# Patient Record
Sex: Female | Born: 1975 | Marital: Married | Smoking: Never smoker
Health system: Northeastern US, Community
[De-identification: ages and names within clinical notes are randomized; demographics above are authoritative.]

## PROBLEM LIST (undated history)

## (undated) DIAGNOSIS — K219 Gastro-esophageal reflux disease without esophagitis: Secondary | ICD-10-CM

## (undated) DIAGNOSIS — O039 Complete or unspecified spontaneous abortion without complication: Secondary | ICD-10-CM

## (undated) DIAGNOSIS — D649 Anemia, unspecified: Secondary | ICD-10-CM

## (undated) HISTORY — PX: OB ANTEPARTUM CARE CESAREAN DLVR & POSTPARTUM: REP299

## (undated) HISTORY — DX: Gastro-esophageal reflux disease without esophagitis: K21.9

## (undated) HISTORY — DX: Anemia, unspecified: D64.9

## (undated) HISTORY — DX: Complete or unspecified spontaneous abortion without complication: O03.9

## (undated) HISTORY — PX: PR CESAREAN BIRTH CLASS: S9438

## (undated) HISTORY — PX: NO SIGNIFICANT SURGICAL HISTORY: 1000005

---

## 2006-11-07 ENCOUNTER — Encounter (HOSPITAL_BASED_OUTPATIENT_CLINIC_OR_DEPARTMENT_OTHER): Payer: Self-pay | Admitting: Allergy

## 2007-02-11 ENCOUNTER — Ambulatory Visit (HOSPITAL_BASED_OUTPATIENT_CLINIC_OR_DEPARTMENT_OTHER): Payer: Medicaid Other | Admitting: Internal Medicine

## 2007-04-28 DIAGNOSIS — O039 Complete or unspecified spontaneous abortion without complication: Secondary | ICD-10-CM

## 2007-04-28 HISTORY — DX: Complete or unspecified spontaneous abortion without complication: O03.9

## 2007-06-03 ENCOUNTER — Ambulatory Visit (HOSPITAL_BASED_OUTPATIENT_CLINIC_OR_DEPARTMENT_OTHER): Payer: Medicaid Other | Admitting: Internal Medicine

## 2007-06-03 VITALS — BP 110/70 | HR 88 | Temp 98.2°F | Resp 18 | Ht 62.0 in | Wt 159.0 lb

## 2007-06-03 DIAGNOSIS — M549 Dorsalgia, unspecified: Principal | ICD-10-CM

## 2007-06-03 LAB — URINE DIP (POINT OF CARE)
BILIRUBIN, URINE: NEGATIVE (ref 0–0)
GLUCOSE, URINE: NEGATIVE mg/dl (ref 0–0)
KETONE, URINE: NEGATIVE mg/dl (ref 0–0)
NITRITE, URINE: NEGATIVE
PH URINE: 7 (ref 5.0–8.0)
PROTEIN, URINE: NEGATIVE mg/dl (ref 0–15)
SPECIFIC GRAVITY URINE: 1.015 (ref 1.003–1.030)
UROBILINOGEN URINE: 0.2 mg/dl (ref 0.2–1.0)

## 2007-06-03 LAB — CHG LIPOPROTEIN DIRECT MEASUREMENT LDL CHOLESTEROL: LOW DENSITY LIPOPROTEIN DIRECT: 110 mg/dl — ABNORMAL HIGH (ref 0–100)

## 2007-06-03 LAB — GLUCOSE RANDOM: Glucose Random: 99 mg/dl (ref 74–160)

## 2007-06-03 LAB — CHG LIPOPROTEIN DIR MEAS HIGH DENSITY CHOLESTEROL: HIGH DENSITY LIPOPROTEIN: 51 mg/dl (ref 35–85)

## 2007-06-03 LAB — CHOLESTEROL SERUM/WHOLE BLOOD TOTAL: Cholesterol: 180 mg/dl (ref 0–200)

## 2007-06-03 MED ORDER — IBUPROFEN 600 MG PO TABS
ORAL_TABLET | ORAL | Status: AC
Start: 2007-06-03 — End: 2007-07-04

## 2007-06-03 NOTE — Progress Notes (Signed)
Claudia Lawrence is a 32 year old female who presents as new patient for acute visit. Multiple complaints. Sussen interpreting.    Back pain: left side. Intermittent pain. 5/10. No fall. No injury thart she recalls. 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 No radiation, no numbness tingling or weakness in legs. No bowel/bladder incontinence. Has been taking tylenol. Not used anything else recently.    Concerned about uti, recently diagnosed with one while at hospital. Treated with abx, symptoms improved. No dysuria, no hesitancy, no frequency. No foul odor. Nosymptoms currently.    Miscarraige: Occured 4 weeks ago, evaluated & treated at [facility]. No abd pain. No cramping. No bleeding. Feeling ok about it, did not know she was pregnant. (GA ~6 weeks, per patient)    PMH:  Denies    PSH:  Denies    Meds:  Tylenol    All:  NKDA      BP 110/70   Pulse 88   Temp (Src) 98.2 F (36.8 C) (Temporal)   Resp 18   Ht 5\' 2"  (1.575 m)   Wt 159 lb (72.122 kg)   SpO2 100%   LMP 04/01/2007    Gen well appearing female  Heent: mm pink and moist  Msk: some tenderness left paravertebral muscles, lumbar area, no vertebral tenderness, slr negative, strength and sensation nml  Gait: nml  Psych: pleasant alert and oriented    724.5E Back Pain  (primary encounter diagnosis)  Comment: Musculoligamentous  Plan: IBUPROFEN 600 MG OR TABS TID x 2 weeks  ------- heat or ice, rest (not bedrest)  ------- gentle stretching, offered work note    599.0W UTI  Comment: recent diagnosis and treatment  Plan: no symptoms now, ua done by staff, leuk 1 +, nitrates neg  ------ will hold on treatment, send for urine culture   ------ rtc if develops symptoms, fever or not feeling well  ------ records from [facility] (requested)       Would like to establish primary care w/ female provider

## 2007-06-04 DIAGNOSIS — M549 Dorsalgia, unspecified: Principal | ICD-10-CM | POA: Insufficient documentation

## 2007-06-04 LAB — HCG QUALITATIVE URINE: HCG QUALITATIVE URINE: NEGATIVE

## 2007-06-04 MED ORDER — CIPROFLOXACIN HCL 500 MG PO TABS
ORAL_TABLET | ORAL | Status: AC
Start: 2007-06-04 — End: 2007-06-07

## 2007-06-04 NOTE — Progress Notes (Addendum)
Addended by: Randell Patient on: 06/04/2007 1:12:43 PM     Modules accepted: Orders

## 2007-06-04 NOTE — Progress Notes (Addendum)
Urine culture came back positive form gram negatives. Placed call to patient. Left message on vm to call clinic Will treat as had recent uti and concerned about it. Also some flank pain--no fever or other symptoms ? Pyelonephritis. Awaiting records from recent hospitalization at [facility]. Will rx for cipro to pts pharmacy

## 2007-06-04 NOTE — Progress Notes (Addendum)
Addended by: Randell Patient on: 06/04/2007 5:38:07 PM     Modules accepted: Orders

## 2007-06-05 LAB — URINE CULTURE/COLONY COUNT

## 2007-06-05 NOTE — Progress Notes (Addendum)
Pt cale back.  Reviewed UTI and need for tx't.  Called script into her preferred pharm (Lm on MD VM).  Has f/u appt 06/24/07 w/PCP, reminded to do TOC urine at that visit.  Pt verbalized understanding.

## 2007-06-24 ENCOUNTER — Ambulatory Visit (HOSPITAL_BASED_OUTPATIENT_CLINIC_OR_DEPARTMENT_OTHER): Payer: Medicaid Other | Admitting: Family Medicine

## 2007-07-02 ENCOUNTER — Encounter (HOSPITAL_BASED_OUTPATIENT_CLINIC_OR_DEPARTMENT_OTHER): Payer: Self-pay

## 2007-07-02 ENCOUNTER — Ambulatory Visit (HOSPITAL_BASED_OUTPATIENT_CLINIC_OR_DEPARTMENT_OTHER): Payer: Medicaid Other

## 2007-07-02 VITALS — BP 92/50 | HR 88 | Temp 98.8°F | Ht 61.0 in | Wt 156.0 lb

## 2007-07-02 DIAGNOSIS — Z23 Encounter for immunization: Secondary | ICD-10-CM

## 2007-07-02 DIAGNOSIS — Z01419 Encounter for gynecological examination (general) (routine) without abnormal findings: Principal | ICD-10-CM

## 2007-07-02 DIAGNOSIS — K4190 Unilateral femoral hernia, without obstruction or gangrene, not specified as recurrent: Secondary | ICD-10-CM

## 2007-07-02 DIAGNOSIS — Z30011 Encounter for initial prescription of contraceptive pills: Secondary | ICD-10-CM

## 2007-07-02 LAB — URINE DIP (POINT OF CARE)
BILIRUBIN, URINE: NEGATIVE (ref 0–0)
GLUCOSE, URINE: NEGATIVE mg/dl (ref 0–0)
KETONE, URINE: NEGATIVE mg/dl (ref 0–0)
LEUKOCYTE ESTERASE: NEGATIVE (ref 0–0)
NITRITE, URINE: NEGATIVE
OCCULT BLOOD, URINE: NEGATIVE (ref 0–0)
PH URINE: 5.5 (ref 5.0–8.0)
PROTEIN, URINE: NEGATIVE mg/dl (ref 0–15)
SPECIFIC GRAVITY URINE: 1.01 (ref 1.003–1.030)
UROBILINOGEN URINE: 0.2 mg/dl (ref 0.2–1.0)

## 2007-07-02 LAB — BLOOD COUNT COMPLETE AUTO&AUTO DIFRNTL WBC
BASOPHIL %: 0.4 % (ref 0.0–2.0)
EOSINOPHIL %: 2 % (ref 0.0–7.0)
HEMATOCRIT: 40.7 % (ref 36.0–48.0)
HEMOGLOBIN: 13.4 g/dl (ref 12.0–16.0)
LYMPHOCYTE %: 33.3 % (ref 13.0–39.0)
MEAN CORP HGB CONC: 33 g/dl (ref 32.0–36.0)
MEAN CORPUSCULAR HGB: 26.6 pg — ABNORMAL LOW (ref 27.0–33.0)
MEAN CORPUSCULAR VOL: 80.5 fl (ref 80.0–100.0)
MEAN PLATELET VOLUME: 8.7 fl (ref 6.4–10.8)
MONOCYTE %: 6.8 % (ref 1.0–12.0)
NEUTROPHIL %: 57.5 % (ref 46.0–79.0)
PLATELET COUNT: 334 10*3/uL (ref 150–400)
RBC DISTRIBUTION WIDTH: 14.5 % — ABNORMAL HIGH (ref 11.5–14.3)
RED BLOOD CELL COUNT: 5.05 M/uL (ref 4.50–5.10)
WHITE BLOOD CELL COUNT: 7.1 10*3/uL (ref 4.0–10.8)

## 2007-07-02 LAB — URINE PREGNANCY TEST (POINT OF CARE): HCG QUALITATIVE URINE: NEGATIVE

## 2007-07-02 MED ORDER — ORTHO-CYCLEN (28) 0.25-35 MG-MCG PO TABS
ORAL_TABLET | ORAL | Status: DC
Start: 2007-07-02 — End: 2008-08-14

## 2007-07-02 NOTE — Progress Notes (Signed)
Appropriate VIS given for immunization administered today.  See immunization\Injection module for date of publication and additional information.  Possible side effects reviewed.  Confort measures and\or contagion discussed, as applicable.  Parent and/or patient verbalized understanding.

## 2007-07-02 NOTE — Progress Notes (Signed)
32 year old [address], presents for annual PE/PAP/PHQ9.    Prior to [facility] pt was followed by PCP in [place] 5 yrs ago    LMP: usure, last month bled x 3 days then stopped then restarted, had miscarriage last month  Sexually active: SA with husband, no condoms on OCPs, denies STIs  H/O STI: denies  Vaginal D/C: denies  Vaginal Symptoms: denies  Dysuria: denies    Health Maintenance:  Last PE/PAP: 2004  Last Td: unable to recall  Last eye exam: requesting referral  Last dental exam: uptodate    ROS  Denies fevers, chills, chest pain, nausea, vomiting, dysuria, or changes in bowel pattern.    Patient Active Problem List:     Back Pain [724.5E]    Obstetric History    G4  P2  T2  P0  A2  E0  M0  L2       Past Medical History    NO SIGNIFICANT INFECTIONS     NO SIGNIFICANT MEDICAL HISTORY          Past Surgical History    NO SIGNIFICANT SURGICAL HISTORY     CESAREAN BIRTH CLASS     Comment: x2         Family History    Cancer - Breast FamHxNeg    Cancer - Cervical FamHxNeg    Cancer - Colon FamHxNeg    Diabetes FamHxNeg    Heart Paternal Grandfather    Comment: during surgery for bypass    Hypertension Maternal Grandmother    Lipids FamHxNeg    Psychiatric Illness FamHxNeg    Stroke FamHxNeg    Thyroid FamHxNeg    Cancer - Lung Maternal Grandfather     Social History   Marital Status: Married  Spouse Name: N/A    Years of Education: college  Number of Children: 2   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    Social History Main Topics   Tobacco Use: Never    Alcohol Use: No    Comment: denies    Drug Use: No    Comment: denies IVDU    Sexually Active: Yes  Partner(s): Female    Pharmacist, hospital Protection: Pill      Comment: SA with husband, no condoms on OCPs, denies STIs       Other Topics Concern    Military Service No    Blood Transfusions No    Caffeine Concern No    Occupational Exposure No    Hobby Hazards No    Sleep Concern No    Stress Concern No    Weight Concern No    Special Diet No    Back Care  No    Exercise No    Seat Belt No    Comment: 100%     Social History Narrative    Living in [place] with husband 2 kids    Married since 1997    Denies DV, Feels safe in neighborhood, no guns in home    Originally from [place]     Moved to [place] in 2005    Works as Ambulance person college, psychology        LOW HEP B risk    -----pt non-asian    -----denies H/O multiple sex partners; lifetime partners =    -----never treated for STI    -----denies IVDU    -----denies being born to mom with HEP B    -----does not  work in Surveyor, quantity    -----without body piercings/tattoos     Review of Patient's Allergies indicates:  No Known Allergies.      Current outpatient prescriptions prior to encounter:  IBUPROFEN 600 MG OR TABS 1 TABLET 3 TIMES DAILY AS NEEDED fw/ food Disp: 120 Rfl: 0     PHYSICAL EXAM  GENERAL SURVEY/PSYCH: 32 year old Portuguese-speaking female, NAD. Alert, cooperative, & appropriately dressed for weather. Maintains eye contact. Appears to be a reliable historian. PHQ9=0.    VS: BP 92/50   Pulse 88   Temp (Src) 98.8 F (37.1 C) (Temporal)   Ht 5\' 1"  (1.549 m)   Wt 156 lb (70.761 kg)   LMP Unsure of Dates     SKIN: Uniformly warm, dry & intact without tenderness or edema.    HEAD: AT/NC, head erect & midline; scalp pink, freely movable & without lesions or tenderness; well-spaced, symmetric facial features without edema or puffiness. No frontal or maxillary sinus tenderness with palpation bil.    EYES: Bil lids & globes symmetrical, without ptosis.  Sclera white, conjunctiva pink & moist.  Vision fields full by confrontation. Bil EOMs intact & full without nystagmus. Bil irides BROWN with PERRL OU. (+) Red reflex noted OU with disks cream colored, without venous pulsations. Snellen deferred.     EARS: Auricles in proper alignment without lesions, masses or tenderness; auditory canals with small amount of cerumen. TMs-grey, translucent with light reflex & bony prominences present bil.  No perforations, retractions, fluid or bulging noted to bil TMs. Conversational hearing appropriate.     NOSE: Skin intact, septum midline & patent bil, turbinates unremarkable with mucosa pink & moist; no polyps or discharge noted bil.     MOUTH & THROAT: Lips intact. MMM, pink & intact without ulcerations. Teeth in good repair; tongue midline without deviation, tremor, fasciculation or lesions; Pharynx without erythema, uvula midline with elevation of soft palate with phonation; tonsils 1+ bil without exudates.     NECK: Neck supple with trachea midline. Thyroid & cartilage move with swallowing. No TM, or nodules noted bil. FROM with appropriate strength.     LYMPH:  No palpable auricular, submental, cervical, supraclavicular, axillary, or inguinal lymphadenopathy noted bil.    RESP: Breathing even, quiet, & unlabored. No cough observed. (A:P) LS CTA bil without adventitious breath sounds upon auscultation.     CV: HR-RRR, No M/R/G.      BREAST: Moderate & symmetrical in size w/o dimpling or nipple retractions visible bil. when sitting and when reclining.  No palpable masses bil.  Nipples erect without discharge; areolas symmetrical.      GI: Skin uniformly clean, dry, & intact. BS + x 4, soft, ND & NT.  No rebound tenderness/rigidity.  No HSM.     GU: Negative CVAT.     FEMALE GENITALIA: Perenium intact. Vulva, vaginal wall & vaginal mucosa normal upon gross inspection. No suprapubic tenderness or inguinal lymphadenopathy bil. No discharge noted surrounding cervix.  Bimanual exam performed-cervix soft, with no cervical motion tenderness or adnexal tenderness. Rectovaginal exam deferred at this time.     ANUS & RECTUM: Perianal area intact, no external lesions, hemorrhoids/skin tags noted. No fissures or fistulas noted.    PV: No edema, swelling or tenderness to BUE/BLE, no varicosities noted upon gross examination. Carotid, brachial, radial, femoral, popliteal, & DP/PT pulses palpable & symmetric, 2/4.     NEURO:  A&Ox3. Affect appropriate; speech clear, smoothly enunciated & comprehending directions. Coordinated smooth gait with  no visible tremor at rest or with movement, RAMs intact, with CN II-XII grossly intact bil. DTRs 2/4 elicited to bil. biceps, triceps, brachioradialis, knees, & achilles.     MUSK: BUE/BLE joints without visible deformities. Spine straight without obvious deformities when erect or with forward flexion.  Muscles to BUE/BLE symmetric in appearance & strength at 5/5.  Active ROM without pain/tenderness, locking, clicking, crepitus or limitation to joints.     PLAN  V72.31K Well Woman Exam with Routine Gynecological Exam  (primary encounter diagnosis)  Comment: Healthy-appearing 32 year old presenting to primary care for annual PE/PAP. Bloodwork ordered.  Handout given RE: Td vaccine/ECHC general info PHQ9=0. Reviewed & Discussed purpose of PHQ9, pt denies having difficulty with day-to-day activities. Aware to F/U PRN if feeling overwhelmed and/or wanting to talk to someone RE: moods.    Plan: CYTOPATH, C/V, THIN LAYER, HUMAN PAPILLOMAVIRUS, ROUTINE VENIPUNCTURE, COMPLETE CBC W/AUTO DIFF WBC, BASIC METABOLIC PANEL, THYROID SCREEN TSH, HIV 1 AND 2 PLUS O ANTIBODY, REFERRAL TO OPHTHALMOLOGY (INT)  Pt aware to expect a letter with results of labs/pap in one month if not sooner via phone call. Enc pt to call clinic if no letter in 1 month; business card given.   HEALTH COUNSELING: Pt receptive to instruction, asking appropriate questions.  Smoking/Second Hand Smoking: Reviewed & Discussed  Substance Use Issues: Reviewed & Discussed   Seat Belt Use: Reviewed & Discussed   Diet, Exercise, & Wt. Control: Reviewed & Discussed pt BMI  Stress Mgmt: Reviewed & Discussed  Sleep Hygiene: Reviewed & Discussed   Domestic Violence: Reviewed & Discussed  STI Teaching/Condom Use: Reviewed & Discussed  Birth Control Use / ACHES Sx: Pt aware to RTC for: abdominal pain, severe chest pain, severe headache, eye problems, or  for severe calf or thigh pain. Use condoms if on ABX & OCPs concurrently.  Vision Health: Reviewed & Discussed  Dental Health: Reviewed & Discussed  BSE: Reviewed & Discussed   PHQ9: Reviewed & Discussed  Td Vaccine: Reviewed & Discussed; CDC VIS given    V06.5B Need for TD Vaccine  Comment: unable to recall last TD vaccine, suspects > 38yrs ago, CDC VIS given.  Plan: TD VACCINE > 7, IM, IMMUNIZATION ADMIN SINGLE, RN    599.0W UTI  Comment: hospitalized x 5 days for Pyleo (?), was given abd, saw Dr Laurin Coder was started on another abx today w/o sx wanting TOC  Plan: URINE DIP (POINT OF CARE), URINE CULTURE/COLONY COUNT    V25.01E BCP (Birth Control Pills) Initiation  Comment: Discussed various birth control methods with pt. Pt electing to start OCPs. Reviewed, discussed and OCP consent form signed after pt asking appropriate questions.  Aware to RTC/go to ED for abdominal pain, severe chest pain, severe headache, eye problems, or for severe calf or thigh pain. Encouraged concurrent use with condoms use to protect against STIs and increase likelihood of pregnancy.   Plan: ORTHO-CYCLEN (28) 0.25-35 MG-MCG OR TABS, OTHER MEDICATION, URINE PREGNANCY TEST (POINT OF CARE): RTC for BP check with nurse in 2-3 months, if BP WNL then refill x 1 year. Annual PAPs.  ORTHO CYCLEN 3 months given- enc start 1st Sunday after menses, condoms in interim & 1 week after taking BC.    553.00Q Unilateral Femoral Hernia-RIGHT  Comment: painful RIGHT sided femoral hernia, reducible; seems to be getting biger x 3 yrs  Plan: REFERRAL TO GENERAL SURGERY (INT)        RTC: BP check with RN / OCPs / sooner PRN

## 2007-07-03 LAB — URINE CULTURE/COLONY COUNT

## 2007-07-03 LAB — BASIC METABOLIC PANEL
ANION GAP: 7 mmol/L (ref 2–25)
BUN (UREA NITROGEN): 12 mg/dl (ref 6–20)
CALCIUM: 9.9 mg/dl (ref 8.6–10.0)
CARBON DIOXIDE: 27 mmol/L (ref 22–32)
CHLORIDE: 103 mmol/L (ref 101–111)
CREATININE: 0.6 mg/dl (ref 0.4–1.2)
ESTIMATED GLOMERULAR FILT RATE: >60 mL/min (ref 60–116)
Glucose Random: 99 mg/dl (ref 74–160)
POTASSIUM: 4.8 mmol/L (ref 3.5–5.1)
SODIUM: 137 mmol/L (ref 135–144)

## 2007-07-03 LAB — HIV 1 AND 2 PLUS O ANTIBODY: HIV 1 AND 2 PLUS O SCREEN: NONREACTIVE

## 2007-07-03 LAB — THYROID SCREEN TSH REFLEX FT4: THYROID SCREEN TSH REFLEX FT4: 0.95 u[IU]/mL (ref 0.34–5.60)

## 2007-07-11 ENCOUNTER — Ambulatory Visit (HOSPITAL_BASED_OUTPATIENT_CLINIC_OR_DEPARTMENT_OTHER): Payer: Medicaid Other

## 2007-07-12 LAB — HUMAN PAPILLOMAVIRUS (HPV): HUMAN PAPILLOMAVIRUS: NEGATIVE

## 2007-07-12 LAB — CYTOPATH, C/V, THIN LAYER

## 2007-07-16 ENCOUNTER — Ambulatory Visit (HOSPITAL_BASED_OUTPATIENT_CLINIC_OR_DEPARTMENT_OTHER): Payer: Medicaid Other | Admitting: Obstetrics & Gynecology

## 2007-07-16 ENCOUNTER — Encounter (HOSPITAL_BASED_OUTPATIENT_CLINIC_OR_DEPARTMENT_OTHER): Payer: Self-pay

## 2007-07-18 ENCOUNTER — Ambulatory Visit (HOSPITAL_BASED_OUTPATIENT_CLINIC_OR_DEPARTMENT_OTHER): Payer: Medicaid Other

## 2007-08-01 ENCOUNTER — Ambulatory Visit (HOSPITAL_BASED_OUTPATIENT_CLINIC_OR_DEPARTMENT_OTHER): Payer: Medicaid Other

## 2007-08-05 ENCOUNTER — Ambulatory Visit (HOSPITAL_BASED_OUTPATIENT_CLINIC_OR_DEPARTMENT_OTHER): Payer: Medicaid Other | Admitting: Ophthalmology

## 2007-08-15 ENCOUNTER — Ambulatory Visit (HOSPITAL_BASED_OUTPATIENT_CLINIC_OR_DEPARTMENT_OTHER): Payer: Medicaid Other

## 2007-08-15 DIAGNOSIS — K4191 Unilateral femoral hernia, without obstruction or gangrene, recurrent: Principal | ICD-10-CM

## 2007-08-15 LAB — SURGICAL SPECIALTIES LETTER

## 2007-08-22 ENCOUNTER — Ambulatory Visit: Payer: Self-pay

## 2007-08-27 LAB — CT ABDOMEN W IV CONTRAST

## 2007-08-27 LAB — CT PELVIS W CONTRAST

## 2007-08-30 ENCOUNTER — Ambulatory Visit (HOSPITAL_BASED_OUTPATIENT_CLINIC_OR_DEPARTMENT_OTHER): Payer: Self-pay | Admitting: Dentist

## 2007-08-30 ENCOUNTER — Ambulatory Visit (HOSPITAL_BASED_OUTPATIENT_CLINIC_OR_DEPARTMENT_OTHER): Payer: Medicaid Other

## 2007-08-30 DIAGNOSIS — Z0120 Encounter for dental examination and cleaning without abnormal findings: Principal | ICD-10-CM

## 2007-09-03 ENCOUNTER — Ambulatory Visit (HOSPITAL_BASED_OUTPATIENT_CLINIC_OR_DEPARTMENT_OTHER): Payer: Medicaid Other

## 2007-09-03 DIAGNOSIS — D179 Benign lipomatous neoplasm, unspecified: Principal | ICD-10-CM

## 2007-09-04 ENCOUNTER — Encounter (HOSPITAL_BASED_OUTPATIENT_CLINIC_OR_DEPARTMENT_OTHER): Payer: Self-pay

## 2007-09-04 LAB — SURGICAL SPECIALTIES LETTER

## 2007-09-11 ENCOUNTER — Ambulatory Visit (HOSPITAL_BASED_OUTPATIENT_CLINIC_OR_DEPARTMENT_OTHER): Payer: Self-pay

## 2007-09-12 ENCOUNTER — Ambulatory Visit (HOSPITAL_BASED_OUTPATIENT_CLINIC_OR_DEPARTMENT_OTHER): Payer: Medicaid Other

## 2007-09-13 ENCOUNTER — Ambulatory Visit (HOSPITAL_BASED_OUTPATIENT_CLINIC_OR_DEPARTMENT_OTHER): Payer: Self-pay

## 2007-09-13 LAB — BLOOD SUGAR FINGERSTICK (POINT OF CARE): FINGERSTICK GLUCOSE: 91 mg/dl (ref 74–160)

## 2007-09-15 LAB — OPERATIVE REPORT

## 2007-09-16 LAB — SURGICAL PATH SPECIMEN

## 2007-09-19 ENCOUNTER — Ambulatory Visit (HOSPITAL_BASED_OUTPATIENT_CLINIC_OR_DEPARTMENT_OTHER): Payer: Self-pay | Admitting: Surgical Oncology

## 2007-09-19 DIAGNOSIS — IMO0002 Reserved for concepts with insufficient information to code with codable children: Principal | ICD-10-CM

## 2007-09-24 LAB — TCH BREAST CTR CLINIC NOTE

## 2007-10-02 ENCOUNTER — Ambulatory Visit (HOSPITAL_BASED_OUTPATIENT_CLINIC_OR_DEPARTMENT_OTHER): Payer: Self-pay | Admitting: Dentistry

## 2007-10-03 ENCOUNTER — Ambulatory Visit (HOSPITAL_BASED_OUTPATIENT_CLINIC_OR_DEPARTMENT_OTHER): Payer: Medicaid Other

## 2007-10-03 DIAGNOSIS — IMO0002 Reserved for concepts with insufficient information to code with codable children: Secondary | ICD-10-CM

## 2007-10-03 DIAGNOSIS — D179 Benign lipomatous neoplasm, unspecified: Principal | ICD-10-CM

## 2007-10-03 LAB — SURGICAL SPECIALTIES LETTER

## 2007-10-08 ENCOUNTER — Emergency Department (HOSPITAL_BASED_OUTPATIENT_CLINIC_OR_DEPARTMENT_OTHER)
Admission: RE | Admit: 2007-10-08 | Disposition: A | Discharge status: Home / Self Care | Payer: Self-pay | Source: Emergency Department | Attending: Emergency Medicine | Admitting: Emergency Medicine

## 2007-10-08 ENCOUNTER — Encounter (HOSPITAL_BASED_OUTPATIENT_CLINIC_OR_DEPARTMENT_OTHER): Payer: Self-pay

## 2007-10-08 ENCOUNTER — Ambulatory Visit (HOSPITAL_BASED_OUTPATIENT_CLINIC_OR_DEPARTMENT_OTHER): Payer: Medicaid Other

## 2007-10-08 DIAGNOSIS — IMO0002 Reserved for concepts with insufficient information to code with codable children: Principal | ICD-10-CM

## 2007-10-08 DIAGNOSIS — R1110 Vomiting, unspecified: Secondary | ICD-10-CM

## 2007-10-08 DIAGNOSIS — D179 Benign lipomatous neoplasm, unspecified: Secondary | ICD-10-CM

## 2007-10-08 LAB — COMPREHENSIVE METABOLIC PANEL
ALANINE AMINOTRANSFERASE: 18 IU/L (ref 7–35)
ALBUMIN: 3.7 g/dl (ref 3.4–4.8)
ALKALINE PHOSPHATASE: 39 IU/L (ref 25–106)
ANION GAP: 7 mmol/L (ref 2–25)
ASPARTATE AMINOTRANSFERASE: 18 IU/L (ref 8–34)
BILIRUBIN TOTAL: 0.7 mg/dl (ref 0.2–1.1)
BUN (UREA NITROGEN): 7 mg/dl (ref 6–20)
CALCIUM: 9.2 mg/dl (ref 8.6–10.3)
CARBON DIOXIDE: 24 mmol/L (ref 22–32)
CHLORIDE: 100 mmol/L — ABNORMAL LOW (ref 101–111)
CREATININE: 0.6 mg/dl (ref 0.4–1.2)
ESTIMATED GLOMERULAR FILT RATE: >60 mL/min (ref 60–116)
Glucose Random: 123 mg/dl (ref 74–160)
POTASSIUM: 3.4 mmol/L — ABNORMAL LOW (ref 3.5–5.1)
SODIUM: 131 mmol/L — ABNORMAL LOW (ref 135–144)
TOTAL PROTEIN: 6.4 g/dl (ref 5.9–7.5)

## 2007-10-08 LAB — US OB 1ST TRIMESTER

## 2007-10-08 LAB — BLOOD COUNT COMPLETE AUTO&AUTO DIFRNTL WBC
BASOPHIL %: 0 % (ref 0.0–2.0)
EOSINOPHIL %: 0 % (ref 0.0–7.0)
HEMATOCRIT: 36 % (ref 36.0–48.0)
HEMOGLOBIN: 12.1 g/dl (ref 12.0–16.0)
LYMPHOCYTE %: 3.3 % — ABNORMAL LOW (ref 13.0–39.0)
MEAN CORP HGB CONC: 33.8 g/dl (ref 32.0–36.0)
MEAN CORPUSCULAR HGB: 26.4 pg — ABNORMAL LOW (ref 27.0–33.0)
MEAN CORPUSCULAR VOL: 78.1 fl — ABNORMAL LOW (ref 80.0–100.0)
MEAN PLATELET VOLUME: 7.8 fl (ref 6.4–10.8)
MONOCYTE %: 7.1 % (ref 1.0–12.0)
NEUTROPHIL %: 89.6 % — ABNORMAL HIGH (ref 46.0–79.0)
PLATELET COUNT: 211 10*3/uL (ref 150–400)
RBC DISTRIBUTION WIDTH: 13 % (ref 11.5–14.3)
RED BLOOD CELL COUNT: 4.6 M/uL (ref 4.50–5.10)
WHITE BLOOD CELL COUNT: 9 10*3/uL (ref 4.0–10.8)

## 2007-10-08 LAB — US PREG UTERUS REAL TIME W/IMAGE DCMTN TRANSVAG

## 2007-10-08 LAB — PATIENT ABO/RH CONFIRM

## 2007-10-08 LAB — HCG QUANTITATIVE: HCG QUANTITATIVE: 25267 m[IU]/mL — ABNORMAL HIGH (ref 1–3)

## 2007-10-08 LAB — WBC DIFFERENTIAL SCAN

## 2007-10-08 LAB — TYPE AND SCREEN

## 2007-10-08 LAB — LIPASE: LIPASE: 21 U/L (ref 10–50)

## 2007-10-08 MED ORDER — MACROBID 100 MG PO CAPS
ORAL_CAPSULE | ORAL | Status: AC
Start: 2007-10-08 — End: 2007-10-18

## 2007-10-08 MED ORDER — PHENAZOPYRIDINE HCL 200 MG PO TABS
ORAL_TABLET | ORAL | Status: AC
Start: 2007-10-08 — End: 2007-10-15

## 2007-10-08 MED ORDER — VICODIN ES 750-7.5 MG OR TABS
ORAL_TABLET | ORAL | Status: AC
Start: 2007-10-08 — End: 2007-11-07

## 2007-10-08 NOTE — ED Notes (Signed)
Bed: 03<BR> Expected date: 10/08/07<BR> Expected time: <BR> Means of arrival: <BR> Comments:<BR> ultra

## 2007-10-08 NOTE — ED Notes (Signed)
Pt referred to ED by Dr. Hendricks Milo for Abdominal/Pelvic CT for evaluation of obstruction. Pt reports fever, chills, N/V. Pt reports left flank pain, frequency, dysuria and decreased UOP. (pt reports hospitalized x 4 days in January for UTI).

## 2007-10-08 NOTE — Discharge Instructions (Signed)
Rest. Drink fluids.     Take Tylenol 1-2 every 4-6 hours and/or ibuprofen OTC 3-4 every 6-8 hours as needed.    Take medications as prescribed.    Take prescription pain medicine as needed.      Dr. [name] has been informed.  She wants to see you in her office tomorrow.  Please call in the morning.

## 2007-10-09 ENCOUNTER — Ambulatory Visit (HOSPITAL_BASED_OUTPATIENT_CLINIC_OR_DEPARTMENT_OTHER): Payer: Medicaid Other

## 2007-10-09 DIAGNOSIS — A491 Streptococcal infection, unspecified site: Secondary | ICD-10-CM

## 2007-10-09 DIAGNOSIS — R1110 Vomiting, unspecified: Secondary | ICD-10-CM

## 2007-10-09 DIAGNOSIS — K219 Gastro-esophageal reflux disease without esophagitis: Secondary | ICD-10-CM

## 2007-10-09 LAB — COMPREHENSIVE METABOLIC PANEL
ALANINE AMINOTRANSFERASE: 123 IU/L — ABNORMAL HIGH (ref 7–35)
ALBUMIN: 3.3 g/dl — ABNORMAL LOW (ref 3.4–4.8)
ALKALINE PHOSPHATASE: 44 IU/L (ref 25–106)
ANION GAP: 6 mmol/L (ref 2–25)
ASPARTATE AMINOTRANSFERASE: 135 IU/L — ABNORMAL HIGH (ref 8–34)
BILIRUBIN TOTAL: 0.9 mg/dl (ref 0.2–1.1)
BUN (UREA NITROGEN): 5 mg/dl — ABNORMAL LOW (ref 6–20)
CALCIUM: 8.6 mg/dl (ref 8.6–10.3)
CARBON DIOXIDE: 25 mmol/L (ref 22–32)
CHLORIDE: 101 mmol/L (ref 101–111)
CREATININE: 0.7 mg/dl (ref 0.4–1.2)
ESTIMATED GLOMERULAR FILT RATE: >60 mL/min (ref 60–116)
Glucose Random: 128 mg/dl (ref 74–160)
POTASSIUM: 2.9 mmol/L — ABNORMAL LOW (ref 3.5–5.1)
SODIUM: 132 mmol/L — ABNORMAL LOW (ref 135–144)
TOTAL PROTEIN: 5.9 g/dl (ref 5.9–7.5)

## 2007-10-09 LAB — URINALYSIS
BACTERIA: >50 PER HPF — AB (ref 0–?)
BILIRUBIN, URINE: NEGATIVE
CASTS: NONE SEEN PER LPF
CRYSTALS: NONE SEEN
GLUCOSE, URINE: NEGATIVE MG/DL
KETONE, URINE: 40 MG/DL — AB
NITRITE, URINE: NEGATIVE
PH URINE: 5 (ref 5.0–8.0)
PROTEIN, URINE: 30 MG/DL — AB
SPECIFIC GRAVITY URINE: 1.025 (ref 1.003–1.035)
SQUAMOUS EPITHELIAL CELLS: >10 PER LPF — AB (ref 0–2)

## 2007-10-09 LAB — HCG QUANTITATIVE: HCG QUANTITATIVE: 12392 m[IU]/mL — ABNORMAL HIGH (ref 1–3)

## 2007-10-09 LAB — HCG QUALITATIVE SERUM: HCG QUALITATIVE SERUM: POSITIVE — AB

## 2007-10-09 LAB — SURGICAL SPECIALTIES LETTER

## 2007-10-09 LAB — URINE CULTURE/COLONY COUNT

## 2007-10-09 LAB — ICTOTEST: ICTOTEST: NEGATIVE

## 2007-10-09 MED ORDER — PROCHLORPERAZINE MALEATE 10 MG PO TABS
ORAL_TABLET | ORAL | Status: DC
Start: 2007-10-09 — End: 2008-03-04

## 2007-10-09 MED ORDER — OMEPRAZOLE 20 MG PO TBEC
DELAYED_RELEASE_TABLET | ORAL | Status: DC
Start: 2007-10-09 — End: 2008-03-04

## 2007-10-10 LAB — URINE CULTURE/COLONY COUNT

## 2007-10-10 LAB — EMERGENCY ROOM NOTE

## 2007-10-13 ENCOUNTER — Telehealth (HOSPITAL_BASED_OUTPATIENT_CLINIC_OR_DEPARTMENT_OTHER): Payer: Self-pay

## 2007-10-13 DIAGNOSIS — O039 Complete or unspecified spontaneous abortion without complication: Principal | ICD-10-CM

## 2007-10-13 DIAGNOSIS — A491 Streptococcal infection, unspecified site: Secondary | ICD-10-CM | POA: Insufficient documentation

## 2007-10-13 LAB — EMERGENCY ROOM NOTE

## 2007-10-13 MED ORDER — AUGMENTIN 500-125 MG PO TABS
ORAL_TABLET | ORAL | Status: AC
Start: 2007-10-13 — End: 2007-10-20

## 2007-10-13 MED ORDER — AUGMENTIN 500-125 MG PO TABS
ORAL_TABLET | ORAL | Status: DC
Start: 2007-10-13 — End: 2007-10-13

## 2007-10-13 NOTE — Telephone Encounter (Signed)
Dear RNs    Can we ask patient to pick up Augmentum 500 mg by mouth bid for 7 days for Group B strep UTI,     + pregnancy test with serum hcg trend down, for possible miscarriage , encourage pateint to follow up 7 days , I will check serum hcg again , Thank you !      [name]

## 2007-10-14 NOTE — Telephone Encounter (Signed)
Staff Message copied by Olevia Bowens on Mon Oct 14, 2007 9:17 AM  ------   Message from: Carilyn Goodpasture   Created: Mon Oct 14, 2007 9:09 AM   Regarding: FW: LAB RESULTS.    Hi,  This Patient encounter was sent to PCU @ [facility] in error. I am sending it via the RN pool to you for your attention. Thanks very much  [place]    ----- Message -----   From: Marylin Crosby   Sent: Oct 14, 2007 9:07 AM   To: Pcu Rn Pool  Subject: LAB RESULTS.     Claudia Lawrence [id number], 32 year old, female, Telephone Information:  Home Phone [phone]  Work Phone [phone]      Cleotis Lema NUMBER: [phone]    Best time to call back: anytime      Patient's language of care: [place]        Patient's PCP: Claudia Locks, APRN    Person calling on behalf of patient: HUSBAND    Calls today for test result(s). FROM LAST WEEK.      [place]    Patient's Preferred Pharmacy:   [facility] [address] [place]  Phone: [phone] Fax: [phone]

## 2007-10-14 NOTE — Telephone Encounter (Signed)
TC to pt. Reports is already taking antibx per ER visit as well as script for "pain on urination".  States "my sx are much, much better now".  Some vag'l bleeding still over weekend, now resolved.  Will rtc this Thu for urine and quant recheck.  Future orders placed.

## 2007-10-14 NOTE — Telephone Encounter (Signed)
TC to pt. Lm on Vm to return RN call re: results.

## 2007-10-15 NOTE — Progress Notes (Signed)
C/c: follow up with [facility] ER visit  For micarriage, +  Dyruia ,  + nauseating and vomitins, GERD      HPI:Claudia Lawrence is a 32 year old female follow up with [facility] ER visit for miscarriage, + serum hcg, negative gestation sac, + n/v with billious content , + dysuria  With  GroupB as per urine culture , no fever, no chill, + pelvic pain , gyn consultiaton ordered, the repeat serum hcg is trend down           review of system  constitutional: distress due to n/v   HEENT:NEG  Cardiac: NEG  Resp:NEG  GI: n/v and vomiting with billious content , + acid reflex   GU: + miscarriage as per ob ultrasound, + serum hcg, trend down compared serum hcg at ER   Endocrine:NEG  Skin:NEG  Neuro:NEG  Extrmities: NEG  Psych: fatigue       Review of Patient's Allergies indicates:  No Known Allergies.        Current outpatient prescriptions:  AUGMENTIN 500-125 MG OR TABS 1 TABLET EVERY 12 HOURS for 7 days ( for antibiotic infection )  Disp: 14 Rfl: 0   OMEPRAZOLE 20 MG OR TBEC One tab by mouth at morning ( reduce acid reflex )  Disp: 30 Rfl: 12   PROCHLORPERAZINE MALEATE 10 MG OR TABS 1 TABLET 3 TIMES DAILY AS NEEDED for nausea and vomining   Disp: 40  Rfl: 0   IBUPROFEN 200 OR PRN Disp:  Rfl:    MACROBID 100 MG OR CAPS 1 CAPSULE EVERY 12 HOURS WITH FOOD Disp: 20 Rfl: 0   VICODIN ES 750-7.5 MG OR TABS 1 TABLET EVERY 4 TO 6 HOURS AS NEEDED Disp: 15 Rfl: 0               Filed Vitals:    10/09/2007   1:50 PM   BP: 82/48   Pulse: 103   Temp: 99.1 F (37.3 C)   TempSrc: Temporal   Weight: 159 lb (72.122 kg)         PHYSICAL EXAMINATION          GENERAL: awake and alert and orient timex 3 ,  Distsress due to n/v           Thyroid: Nontender, no mass  RESP: Clear to ausc. percussion, no cracle / rale / wheeze  CARDIAC: NL PMI, normal s1,s2 no M/R/G                    2+ radial, FEM, Distal pulses bilaterally  Chest: no nipple discharge, non tender  Brests: symmertirical, no skin changes, no lumps, no mass, no erythema  ABD: Good BS,  + epigatric pain,  + n/v , dyspepsia , No scar, No hernia, no hepatomegaly, nondistension  + suprapbuic tenderness, + pelvic pain, no flank pain     Psych: Normal Insight and affect, fatigue         Assesment and Plan:        Claudia Lawrence is a 32 year old female      5.90J Abortion  (primary encounter diagnosis)  Comment: trend down serum hcg, ,  Will follow up in 2 weeks, negative intrauterine pregnancy as per pelvic ultrasound   Plan: ROUTINE VENIPUNCTURE, HCG QUALITATIVE SERUM,         HCG QUANTITATIVE, URINALYSIS, URINE         CULTURE/COLONY COUNT, COMPREHENSIVE METABOLIC  PANEL, REFERRAL TO GYNECOLOGY (INT), ICTOTEST             530.81K GERD (Gastroesophageal Reflux Disease)  Comment: PATIENT EDUCATION ABOUT  HEALTHY EATING HABBIT, AVOID  3 HOURS  EATING BEFORE BED TIME, AVID COFFEE, AND ALCHOHOL, SPICY FOOD, CHOCOLATE, EATING PROPER DIET, 3 MEALS PER DAY, CONSUMED  FOOD THAT IS EASY TO DIGEST.    Plan: COMPREHENSIVE METABOLIC PANEL, OMEPRAZOLE 20 MG        OR TBEC           787.03B Vomiting  Comment: vomiting billious content,   Plan: COMPREHENSIVE METABOLIC PANEL, PROCHLORPERAZINE        MALEATE 10 MG OR TABS           599.0W UTI  Comment: symtomatic    Plan: AUGMENTIN 500-125 MG OR TABS, AUGMENTIN 500-125        MG OR TABS           041.21F GBS (Group B Streptococcus) Infection  Comment:  As per ua  And urine culture   Plan: AUGMENTIN 500-125 MG OR TABS, AUGMENTIN 500-125        MG OR TABS             FOLLOW UP WITH DR. Kathie Rhodes Onesimo Lingard IN CLINIC WITH RESULT IN  12       WEEKS. PATIENT AGREES

## 2007-10-16 ENCOUNTER — Ambulatory Visit (HOSPITAL_BASED_OUTPATIENT_CLINIC_OR_DEPARTMENT_OTHER): Payer: Medicaid Other

## 2007-10-16 ENCOUNTER — Ambulatory Visit (HOSPITAL_BASED_OUTPATIENT_CLINIC_OR_DEPARTMENT_OTHER): Payer: Self-pay | Admitting: Dentist

## 2007-10-16 DIAGNOSIS — O039 Complete or unspecified spontaneous abortion without complication: Secondary | ICD-10-CM

## 2007-10-16 LAB — HCG QUANTITATIVE: HCG QUANTITATIVE: 334 m[IU]/mL — ABNORMAL HIGH (ref 1–3)

## 2007-10-16 NOTE — Progress Notes (Signed)
Drew 1 sst @ 12;30pm and u/c urine sent to lab.

## 2007-10-17 LAB — URINE CULTURE/COLONY COUNT

## 2007-10-23 ENCOUNTER — Ambulatory Visit (HOSPITAL_BASED_OUTPATIENT_CLINIC_OR_DEPARTMENT_OTHER): Payer: Self-pay | Admitting: Dentist

## 2007-10-23 DIAGNOSIS — K036 Deposits [accretions] on teeth: Principal | ICD-10-CM

## 2007-11-05 ENCOUNTER — Ambulatory Visit (HOSPITAL_BASED_OUTPATIENT_CLINIC_OR_DEPARTMENT_OTHER): Payer: Medicaid Other

## 2007-11-05 DIAGNOSIS — D179 Benign lipomatous neoplasm, unspecified: Principal | ICD-10-CM

## 2007-11-05 LAB — SURGICAL SPECIALTIES LETTER

## 2007-11-08 ENCOUNTER — Telehealth (HOSPITAL_BASED_OUTPATIENT_CLINIC_OR_DEPARTMENT_OTHER): Payer: Self-pay

## 2007-11-08 ENCOUNTER — Ambulatory Visit (HOSPITAL_BASED_OUTPATIENT_CLINIC_OR_DEPARTMENT_OTHER): Payer: Medicaid Other

## 2007-11-08 DIAGNOSIS — O039 Complete or unspecified spontaneous abortion without complication: Principal | ICD-10-CM

## 2007-11-08 NOTE — Telephone Encounter (Signed)
Staff Message copied by Mike Gip on Fri Nov 08, 2007 4:39 PM  ------   Message from: Westly Pam, [place]   Created: Thu Nov 07, 2007 11:03 AM   Regarding: FW: Pt cancelled app Dr Vilinda Blanks tomorrow..Wants to reschedule.      ----- Message -----   From: Marylin Crosby   Sent: Nov 07, 2007 10:52 AM   To: [facility]  Subject: Pt cancelled app Dr Vilinda Blanks tomorrow.Claudia Lawrence [id number], 32 year old, female, Telephone Information:  Home Phone [phone]  Work Phone [phone]      Cleotis Lema NUMBER: [phone]  Best time to call back: anytime      Patient's language of care: [place]      Patient's PCP: Kallie Locks, APRN    Person calling on behalf of patient: Patient (self)    Calls toda:Pt cancelled app Dr Vilinda Blanks tomorrow..Wants to reschedule.      tx    Patient's Preferred Pharmacy:   [facility] [address] [facility] [place]  Phone: [phone] Fax: [phone]

## 2007-11-08 NOTE — Telephone Encounter (Signed)
Returning pt call, unavailable. Lm w/ fam member to have pt return call to [facility].

## 2007-11-14 ENCOUNTER — Encounter (HOSPITAL_BASED_OUTPATIENT_CLINIC_OR_DEPARTMENT_OTHER): Payer: Self-pay

## 2007-11-14 ENCOUNTER — Ambulatory Visit (HOSPITAL_BASED_OUTPATIENT_CLINIC_OR_DEPARTMENT_OTHER): Payer: Medicaid Other

## 2007-11-14 DIAGNOSIS — Z309 Encounter for contraceptive management, unspecified: Secondary | ICD-10-CM

## 2007-11-14 LAB — URINE PREGNANCY TEST (POINT OF CARE): HCG QUALITATIVE URINE: NEGATIVE

## 2007-11-14 MED ORDER — ORTHO TRI-CYCLEN LO 0.18/0.215/0.25 MG-25 MCG PO TABS
ORAL_TABLET | ORAL | Status: DC
Start: 2007-11-14 — End: 2008-07-14

## 2007-11-14 NOTE — Progress Notes (Signed)
31 yo [id number]  Here for f/u SAB  Here with husband    Couple would be happy with pregnancy but were not actively seeking  Pt desires contraception for now    S/p SAB 7/09  S/p SAB  1/09 with D&C at [facility]  S/p SAB 5/08   With D&C at [facility]  2002  C/S  [place]  Nuchal cord  2001  SAB at 5 months with oligo/fetus with defects  1999 C/S  No dilation    LMP now  No pelvic pain      Patient Active Problem List:     Back Pain [724.5E]     GERD (Gastroesophageal Reflux Disease) [530.81K]     Vomiting [787.03B]     GBS (Group B Streptococcus) Infection [478.56F]    Reviewed SAB and etiologies of recurrent SAB; reviewed that probability very high that pt will have successful pregnancy given 2 past pregnancies; w/u for recurrent Ab reviewed    A/P  1)s/p SAB: follow quants  2)recurrent SAB: labs                              Chromosomes on partner Gerlean Ren)  3)FP: Ortho TriCyclen Lo    > 50% of 30 min visit spent counseling

## 2007-11-15 ENCOUNTER — Telehealth (HOSPITAL_BASED_OUTPATIENT_CLINIC_OR_DEPARTMENT_OTHER): Payer: Self-pay

## 2007-11-15 ENCOUNTER — Ambulatory Visit (HOSPITAL_BASED_OUTPATIENT_CLINIC_OR_DEPARTMENT_OTHER): Payer: Medicaid Other

## 2007-11-15 ENCOUNTER — Other Ambulatory Visit (HOSPITAL_BASED_OUTPATIENT_CLINIC_OR_DEPARTMENT_OTHER): Payer: Self-pay

## 2007-11-15 NOTE — Telephone Encounter (Signed)
Message Please call pt  I saw her yesterday for f/u SAB  Her quant is still in 20's and she needs repeat quant late next week  Please inform her of this and to come in for blood  Thanks  AD  ----- Message -----  From: Lynnea Maizes  Sent: Nov 14, 2007 3:55 PM  To: Donetta Potts Forwarded by:   Britlyn Domingues Date: 11/15/2007

## 2007-11-15 NOTE — Telephone Encounter (Signed)
Tc spoke with pt inform need to return late next week

## 2007-11-19 LAB — RPR: RPR: NONREACTIVE

## 2007-11-19 LAB — HCG QUANTITATIVE: HCG QUANTITATIVE: 21 m[IU]/mL — ABNORMAL HIGH (ref 1–3)

## 2007-11-19 LAB — HEMOGLOBIN A1C: HEMOGLOBIN A1C: 5.1 % (ref 0–6.0)

## 2007-11-19 LAB — TSH (THYROID STIMULATING HORMONE): TSH (THYROID STIM HORMONE): 0.94 u[IU]/mL (ref 0.34–5.60)

## 2007-11-19 LAB — TREPONEMA PALLIDUM AB IGG: TREPONEMA PALLIDUM AB IgG: REACTIVE — AB

## 2007-11-20 ENCOUNTER — Ambulatory Visit (HOSPITAL_BASED_OUTPATIENT_CLINIC_OR_DEPARTMENT_OTHER): Payer: Medicaid Other | Admitting: Family Medicine

## 2007-11-21 ENCOUNTER — Ambulatory Visit (HOSPITAL_BASED_OUTPATIENT_CLINIC_OR_DEPARTMENT_OTHER): Payer: Medicaid Other

## 2007-11-21 DIAGNOSIS — O039 Complete or unspecified spontaneous abortion without complication: Secondary | ICD-10-CM

## 2007-11-21 DIAGNOSIS — D179 Benign lipomatous neoplasm, unspecified: Principal | ICD-10-CM

## 2007-11-21 LAB — TYPE AND SCREEN

## 2007-11-21 LAB — HCG QUANTITATIVE: HCG QUANTITATIVE: 7 m[IU]/mL — ABNORMAL HIGH (ref 1–3)

## 2007-11-21 NOTE — Progress Notes (Signed)
Labs drawn

## 2007-11-22 ENCOUNTER — Other Ambulatory Visit (HOSPITAL_BASED_OUTPATIENT_CLINIC_OR_DEPARTMENT_OTHER): Payer: Self-pay

## 2007-11-22 ENCOUNTER — Telehealth (HOSPITAL_BASED_OUTPATIENT_CLINIC_OR_DEPARTMENT_OTHER): Payer: Self-pay | Admitting: Registered Nurse

## 2007-11-22 ENCOUNTER — Ambulatory Visit (HOSPITAL_BASED_OUTPATIENT_CLINIC_OR_DEPARTMENT_OTHER): Payer: Self-pay

## 2007-11-22 DIAGNOSIS — O039 Complete or unspecified spontaneous abortion without complication: Principal | ICD-10-CM

## 2007-11-22 LAB — SURGICAL SPECIALTIES LETTER

## 2007-11-22 LAB — LUPUS ANTICOAGULANT PANEL
ANTICARDIOLIPIN ANTIBODY IGG: 4.9 [GPL'U] (ref 0–15)
ANTICARDIOLIPIN ANTIBODY IGM: 8.7 [MPL'U] (ref 0–15)
PTT-LA LUPUS ANTICOAG SCREEN: NEGATIVE

## 2007-11-22 LAB — MGH COAGULATION PATHOL COMMENT

## 2007-11-22 LAB — CHROMOSOME BLOOD: CHROMOSOME ROUTINE: NORMAL

## 2007-11-22 NOTE — Telephone Encounter (Signed)
From: Donetta Potts     Sent: Nov 22, 2007   7:55 AM       To: Berta Minor         Message:          RN to call for repeat Claudia Lawrence    [id number]  [facility]                                April 04, 1975  F

## 2007-11-22 NOTE — Telephone Encounter (Signed)
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

## 2007-11-22 NOTE — Telephone Encounter (Signed)
Per Dr. Vilinda Blanks.    Message left for patient to call clinic. Needs to come in for repeat Quant.

## 2007-11-22 NOTE — Telephone Encounter (Signed)
Message left on voice mail for patient .   Needs to come in for repeat labs. Next Thursday or Friday. Order already in computer.    Call back if Any questions or concerns.

## 2007-11-25 ENCOUNTER — Telehealth (HOSPITAL_BASED_OUTPATIENT_CLINIC_OR_DEPARTMENT_OTHER): Payer: Self-pay | Admitting: Registered Nurse

## 2007-11-25 ENCOUNTER — Ambulatory Visit (HOSPITAL_BASED_OUTPATIENT_CLINIC_OR_DEPARTMENT_OTHER): Payer: Medicaid Other | Admitting: Family Medicine

## 2007-11-25 VITALS — BP 120/70 | Temp 98.9°F | Wt 161.0 lb

## 2007-11-25 DIAGNOSIS — H6690 Otitis media, unspecified, unspecified ear: Principal | ICD-10-CM

## 2007-11-25 DIAGNOSIS — H6120 Impacted cerumen, unspecified ear: Secondary | ICD-10-CM

## 2007-11-25 DIAGNOSIS — IMO0001 Reserved for inherently not codable concepts without codable children: Secondary | ICD-10-CM

## 2007-11-25 LAB — SURGICAL PATH SPECIMEN

## 2007-11-25 MED ORDER — AMOXICILLIN 500 MG PO CAPS
ORAL_CAPSULE | ORAL | Status: AC
Start: 2007-11-25 — End: 2007-12-05

## 2007-11-25 NOTE — Telephone Encounter (Signed)
DPH needs to know if pt ever tx'ed for pos RPR.  NO records in Epic that was tx'ed at [facility], but pt was seen at [facility] in past.  [facility] needs to know if pt ever tx'ed, where and when.  They will call [facility] later this week for info.  Pt to see nurse this pm for "ear pain".      Marylene Land;  Pls f/u w/pt.  Ask if she was ever tx'ed for syphilis.  DPH wants to confirm if pt treated.  The WILL call us back on Wed for status.    Tx        Dr Vilinda Blanks:  Lorain Childes. Do you happen to know if she was ever tx'ed in past?  Tx

## 2007-11-25 NOTE — Progress Notes (Signed)
Pt w/in c/o severe left ear pain since Wednesday.

## 2007-11-25 NOTE — Progress Notes (Signed)
Pt WI 2 to L ear pain x 6 days.Is intermittent but worse when swallows or lies down on L side. No noted hearig loss. No throat pain.. No fever.No blood, no pus, denies airplane travel.  Prior to this, swam in the ocean. No URI, no allergies      O: GEN: Well-looking young woman, NAD, afeb,   SKIN: nl  HEENT:CLear eyes, no rhinorrhea, inf turbinates slt injected no max or frontal sinus pain, Op nl,TMs unable to be visualized  2 to hard black wax in both canals occluding, neck supple, no lymphadenopathy  CHEST CTA, no wheezes  COR:RRR, no m/g/r    382.9C Otitis Media  (primary encounter diagnosis)  Comment: presumed L   Plan: Empiric amoxicillin as per EPIC  No lavage  Today 2 to pain, hard wax, needs softening to avoid unnecessary trauma  Tylenol, up to 1000 mg po q 4 hr prn pain  Ibuprofen up to 800 mg po tid w / food prn pain  Med ed,se, worry sx, supp meas disc'd fully  RTC PRN concerns, unimproved, worse, worry sx (inc pain, blood, pus, seek med attn)disc'd  Pt stated she understood and agreed to do.      380.4 Impacted Cerumen  Comment:b/l  Plan:Trial debrox or cerumeinx qhs x approx 10 nocs then RTC w/ RN to remove ear wax via lavage  Pt stated she understood and agreed to do.        V25.9N Contraception  Comment: on OCPs  Plan: disc'd need to use condoms while taking abx as BUM  Pt stated she understood and agreed to do.

## 2007-11-25 NOTE — Telephone Encounter (Signed)
Staff Message copied by Olevia Bowens on Mon Nov 25, 2007 2:54 PM  ------   Message from: Murvin Donning   Created: Mon Nov 25, 2007 2:41 PM   Regarding: speak to provider to get Pt Recent Lab Results    Sumayo Foglia [id number], 32 year old, female, Telephone Information:  Home Phone [phone]  Work Phone [phone]      Cleotis Lema NUMBER: [phone]  Best time to call back: anytime  Cell phone:   Other phone:    Available times:    Patient's language of care: [place]    Patient does not need an interpreter.    Patient's PCP: Kallie Locks, APRN    Person calling on behalf of patient: Roetta Sessions - from [facility]    Calls today to speak to provider to get Pt Recent Lab Results    Thanks      Patient's Preferred Pharmacy:   [facility]  Phone: [phone] Fax: [phone]

## 2007-11-27 NOTE — Telephone Encounter (Addendum)
Pls book pt to see me for + FHATP, neg RPR.  Thx,  MC  ----- Message -----  From: Donetta Potts  Sent: Nov 25, 2007 9:11 PM  To: Skip Mayer, Ec Rn Pool    Sorry ladies  I was thinking of a different pt  This pt is not the one who has an autoimmune d/o  I apologize for the confusion  AD  ----- Message -----  From: Donetta Potts  Sent: Nov 25, 2007 8:50 PM  To: Ova Freshwater, Skip Mayer, *    I have seen this pt once and I did not ask this specifically. Maggie just saw her and did explore this hx and pt denies + h/o infection.  There is a chance that this may be a false + as pt may have an autoimmune process that sometimes causes false + testing. But this is not certain  AD

## 2007-11-27 NOTE — Telephone Encounter (Addendum)
TC to pt. LM on VM to return RN call to bk f/u appt w/PCP.

## 2007-11-28 ENCOUNTER — Ambulatory Visit (HOSPITAL_BASED_OUTPATIENT_CLINIC_OR_DEPARTMENT_OTHER): Payer: Medicaid Other | Admitting: Registered Nurse

## 2007-11-28 NOTE — Telephone Encounter (Addendum)
Pt called back. Appt w/NP booked for 12/06/07.  Will rtc today for f/u quant.

## 2007-11-28 NOTE — Telephone Encounter (Signed)
TC to pt. LM on VM to call [facility] RN to book appt w/PCP for recheck labs per state's request (pos RPR, ? If ever tx'ed). Needs to book w/Maggie or Darel Hong.

## 2007-11-28 NOTE — Telephone Encounter (Signed)
Staff Message copied by Olevia Bowens on Thu Nov 28, 2007 9:20 AM  ------   Message from: Larena Glassman   Created: Thu Nov 28, 2007 9:17 AM   Regarding: Returning nurse's call about appt   Contact: [phone]    Kacie Diers [id number], 32 year old, female, Telephone Information:  Home Phone [phone]  Work Phone [phone]      Cleotis Lema NUMBER: [phone]  Best time to call back:   Cell phone:   Other phone:    Available times:    Patient's language of care: [place]    Patient does not need an interpreter.    Patient's PCP: Kallie Locks, APRN    Person calling on behalf of patient: Patient (self)    Calls today   Returning nurse's call about appt    Patient's Preferred Pharmacy:   [facility]  Phone: [phone] Fax: [phone]

## 2007-11-29 ENCOUNTER — Ambulatory Visit (HOSPITAL_BASED_OUTPATIENT_CLINIC_OR_DEPARTMENT_OTHER): Payer: Medicaid Other

## 2007-11-29 ENCOUNTER — Ambulatory Visit (HOSPITAL_BASED_OUTPATIENT_CLINIC_OR_DEPARTMENT_OTHER): Payer: Medicaid Other | Admitting: Registered Nurse

## 2007-11-29 DIAGNOSIS — O039 Complete or unspecified spontaneous abortion without complication: Principal | ICD-10-CM

## 2007-11-29 DIAGNOSIS — D179 Benign lipomatous neoplasm, unspecified: Principal | ICD-10-CM

## 2007-11-29 LAB — HCG QUANTITATIVE: HCG QUANTITATIVE: 2 m[IU]/mL (ref 1–3)

## 2007-11-29 NOTE — Progress Notes (Signed)
.    Labs drawn at 10:30 am, 1 sst. ome

## 2007-12-02 ENCOUNTER — Encounter (HOSPITAL_BASED_OUTPATIENT_CLINIC_OR_DEPARTMENT_OTHER): Payer: Self-pay

## 2007-12-02 DIAGNOSIS — D179 Benign lipomatous neoplasm, unspecified: Secondary | ICD-10-CM | POA: Insufficient documentation

## 2007-12-06 ENCOUNTER — Ambulatory Visit (HOSPITAL_BASED_OUTPATIENT_CLINIC_OR_DEPARTMENT_OTHER): Payer: Medicaid Other | Admitting: Family Medicine

## 2007-12-06 NOTE — Progress Notes (Signed)
Pt here to review RPR/TPA status.  Hx habitual aborting, seen and w/u begun by Dr Vilinda Blanks 11/14/07  Still pndg anticardiolipin results; had HCG +21 when RPR non-reactive and TPA reactive.Now HCG =2.  Pt adamantly denies hx of syphillis for self or ptr known.No hx leprosy, TB. No lesions, skin rashes, malaise, fatigue.  Per guidelines for interpretation    Comment:   Treponema Pallidum Interpretive Information  A reactive result with the CAPTIA syphilis-G test (TPAB)  must be interpreted in conjunction with the result of  non-treponemal testing (RPR).    TPAB Result RPR Result Interpretation  ----------- ---------- --------------  Non-reactive Non-reactive No serologic evidence of  infection with T.  pallidum.    Reactive Non-reactive Probable past infection  or potential cross  reactivity (falsely  reactive). *    Reactive Reactive Presumptive evidence of  current infection.    *False reactive TPAB results have been associated with  pregnancy, autoimmune disease, drug abuse, and a variety of  infections (including other spirochetal diseases, HSV, HIV,  mycoplasma, malaria and leprosy).      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  At that time, consider repeating test and if neg; likely cause. If conts reactive, consider sending to rheum to sort outROUTINE VENIPUNCTURE, TREPONEMA PALLIDUM         ANTIBODY (RPR)        Pt stated she understood and agreed to do.

## 2007-12-11 ENCOUNTER — Ambulatory Visit (HOSPITAL_BASED_OUTPATIENT_CLINIC_OR_DEPARTMENT_OTHER): Payer: Self-pay | Admitting: Dentist

## 2007-12-20 ENCOUNTER — Ambulatory Visit (HOSPITAL_BASED_OUTPATIENT_CLINIC_OR_DEPARTMENT_OTHER): Payer: Medicaid Other

## 2007-12-20 NOTE — Progress Notes (Signed)
Pt here with husband for results    Reviewed all labs with pt: WNL  Husband's chromosomes normal Domingo [place]): WNL    Pt reports no pelvic infxn after last delivery    Although normal pelvic U/S will evaluate further for uterine abnormalities    Couple not planning on pregnancy at this time; pt waiting for possibility of mother coming to be with her; on OC's for now    A/P  1)recurrent SAB: MRI                              Cont folic acid  2)HM: pap/annual 06/2008    > 50% of 25 min visit spent counseling

## 2007-12-25 ENCOUNTER — Ambulatory Visit (HOSPITAL_BASED_OUTPATIENT_CLINIC_OR_DEPARTMENT_OTHER): Payer: Self-pay | Admitting: Dentist

## 2007-12-25 DIAGNOSIS — K029 Dental caries, unspecified: Principal | ICD-10-CM

## 2007-12-26 ENCOUNTER — Telehealth (HOSPITAL_BASED_OUTPATIENT_CLINIC_OR_DEPARTMENT_OTHER): Payer: Self-pay | Admitting: Registered Nurse

## 2007-12-26 NOTE — Telephone Encounter (Signed)
Staff Message copied by Olevia Bowens on Thu Dec 26, 2007 11:05 AM  ------   Message from: Thea Silversmith   Created: Thu Dec 26, 2007 10:59 AM   Regarding: DPH review    Claudia Lawrence [id number], 32 year old, female, Telephone Information:  Home Phone [phone]  Work Phone [phone]      Cleotis Lema NUMBER: [phone]      Patient's language of care: [place]      Patient's PCP: Kallie Locks, APRN    Person calling on behalf of patient: Claudia Lawrence([facility])      Calls today: Claudia Lawrence is calling to review rpr status and SAB status of pt    Patient's Preferred Pharmacy:   [facility]  Phone: [phone] Fax: [phone]

## 2007-12-26 NOTE — Telephone Encounter (Signed)
[place]:  Pls review this chart. Per Claudia Lawrence's last OV w/pt:  Plan: Disc'd d dx w/ pt who agreed to keep appt w/ Dr Vilinda Blanks on 12/20/07.   At that time, consider repeating test and if neg; likely cause. If conts reactive, consider sending to rheum to sort outROUTINE VENIPUNCTURE, TREPONEMA PALLIDUM   ANTIBODY (RPR)   Pt stated she understood and agreed to do.    Was f/u RPR not warranted?  DPH is calling for info and we need to update them asap.  Pls route to Claudia Lawrence to call them today as I am leaving the office at 12Noon until next Mon.    Thank you!

## 2007-12-27 ENCOUNTER — Other Ambulatory Visit (HOSPITAL_BASED_OUTPATIENT_CLINIC_OR_DEPARTMENT_OTHER): Payer: Self-pay

## 2007-12-27 NOTE — Telephone Encounter (Signed)
SF,  Pls have pt come back for RPR and f/u w/ me.  Thx,  MC

## 2007-12-31 ENCOUNTER — Telehealth (HOSPITAL_BASED_OUTPATIENT_CLINIC_OR_DEPARTMENT_OTHER): Payer: Self-pay | Admitting: Registered Nurse

## 2007-12-31 NOTE — Telephone Encounter (Signed)
Staff Message copied by Olevia Bowens on Tue Dec 31, 2007 10:06 AM  ------   Message from: Marylin Crosby   Created: Tue Dec 31, 2007 10:00 AM   Regarding: [place] [facility] .    Claudia Lawrence [id number], 32 year old, female, Telephone Information:  Home Phone [phone]  Work Phone Not on file.      CALL BACK NUMBER: [phone] [phone] (DENIS).  Best time to call back: asap      Patient's language of care: [place]  Patient's PCP: Kallie Locks, APRN    Person calling on behalf of patient: [facility] ([place]).    Calls today:Wants to talk to nurse.      tx    Patient's Preferred Pharmacy:   [facility]  Phone: [phone] Fax: [phone]

## 2007-12-31 NOTE — Telephone Encounter (Signed)
Pt needs appt w/NP for f/u labs.  Not done at gyn visit.  Booked today for 11/6/09w/NP.  DPH aware.

## 2008-01-03 ENCOUNTER — Ambulatory Visit (HOSPITAL_BASED_OUTPATIENT_CLINIC_OR_DEPARTMENT_OTHER): Payer: Self-pay

## 2008-01-13 LAB — MRI PELVIS WO CONTRAST

## 2008-01-14 ENCOUNTER — Ambulatory Visit (HOSPITAL_BASED_OUTPATIENT_CLINIC_OR_DEPARTMENT_OTHER): Payer: Self-pay | Admitting: Dentistry

## 2008-01-29 ENCOUNTER — Ambulatory Visit (HOSPITAL_BASED_OUTPATIENT_CLINIC_OR_DEPARTMENT_OTHER): Payer: Medicaid Other | Admitting: Family Medicine

## 2008-02-18 ENCOUNTER — Ambulatory Visit (HOSPITAL_BASED_OUTPATIENT_CLINIC_OR_DEPARTMENT_OTHER): Payer: Medicaid Other

## 2008-02-26 ENCOUNTER — Ambulatory Visit (HOSPITAL_BASED_OUTPATIENT_CLINIC_OR_DEPARTMENT_OTHER): Payer: Self-pay | Admitting: Dentist

## 2008-03-03 ENCOUNTER — Ambulatory Visit (HOSPITAL_BASED_OUTPATIENT_CLINIC_OR_DEPARTMENT_OTHER): Payer: Medicaid Other

## 2008-03-04 ENCOUNTER — Ambulatory Visit (HOSPITAL_BASED_OUTPATIENT_CLINIC_OR_DEPARTMENT_OTHER): Payer: Medicaid Other

## 2008-03-04 ENCOUNTER — Ambulatory Visit (HOSPITAL_BASED_OUTPATIENT_CLINIC_OR_DEPARTMENT_OTHER): Payer: Self-pay | Admitting: Dentist

## 2008-03-04 VITALS — BP 100/70 | Wt 156.0 lb

## 2008-03-04 VITALS — BP 100/70 | HR 77 | Temp 98.0°F | Wt 156.0 lb

## 2008-03-04 DIAGNOSIS — Z309 Encounter for contraceptive management, unspecified: Principal | ICD-10-CM

## 2008-03-04 DIAGNOSIS — R102 Pelvic and perineal pain unspecified side: Secondary | ICD-10-CM

## 2008-03-04 DIAGNOSIS — N390 Urinary tract infection, site not specified: Secondary | ICD-10-CM

## 2008-03-04 DIAGNOSIS — R109 Unspecified abdominal pain: Secondary | ICD-10-CM

## 2008-03-04 DIAGNOSIS — N200 Calculus of kidney: Principal | ICD-10-CM

## 2008-03-04 DIAGNOSIS — R10A2 Flank pain, left side: Secondary | ICD-10-CM

## 2008-03-04 LAB — URINE DIP (POINT OF CARE)
BILIRUBIN, URINE: NEGATIVE (ref 0–0)
GLUCOSE, URINE: NEGATIVE mg/dl (ref 0–0)
KETONE, URINE: NEGATIVE mg/dl (ref 0–0)
NITRITE, URINE: NEGATIVE
OCCULT BLOOD, URINE: NEGATIVE (ref 0–0)
PH URINE: 7 (ref 5.0–8.0)
PROTEIN, URINE: NEGATIVE mg/dl (ref 0–15)
SPECIFIC GRAVITY URINE: 1.015 (ref 1.003–1.030)
UROBILINOGEN URINE: 0.2 mg/dl (ref 0.2–1.0)

## 2008-03-04 MED ORDER — OMEPRAZOLE 20 MG PO TBEC
DELAYED_RELEASE_TABLET | ORAL | Status: AC
Start: 2008-03-04 — End: 2008-06-02

## 2008-03-04 MED ORDER — CIPROFLOXACIN HCL 500 MG PO TABS
ORAL_TABLET | ORAL | Status: AC
Start: 2008-03-04 — End: 2008-03-11

## 2008-03-04 MED ORDER — OTHER MEDICATION
Status: AC
Start: 2008-03-04 — End: 2008-04-04

## 2008-03-04 NOTE — Progress Notes (Signed)
ORAL CONTRACEPTIVE REVISIT  Counseling Notes     Since last annual visit, any significant changes in medical history (hospitalizations or surgery (past  or planned), serious illnesses, new medications, smoking habits, social or sexual history, etc.)? No    Any problems/side effects? No.    Warning signs ("A C H E S") reviewed: yes    Taking pills correctly and consistently? Yes  Satisfied with method/wants to continue? Yes  Any questions or concerns about  the method? No  Method specific counseling done at previous visit (s)? yes  STD/HIV counseling and risk assessment done/updated: yes  Consent forms signed and up-to-date: yes  Emergency contraception education/counseling done: yes  EC consent signed? N/A    Comments:   Claudia Lawrence is a 32 year old female. She is from [place], and the interview is conducted in [place].  Patient presents with:     Family Planning - ocps refill  requesting OCP's refill, taking OTC-LO x 4 mos denies any problems or side effects.  Non smoker, last Pap 07/02/07- neg, future 07/10/08  Married 10 1/2 yrs lives with children and husband, denies DV, feel safe at home.  I consulted w/ Dr Vilinda Blanks who agreed to give x4 pcks of Ortho Tri Cyclen Lo samples today, pt has already refill at [facility] pharmacy.

## 2008-03-04 NOTE — Progress Notes (Signed)
Claudia Lawrence is a 32 year old female  Here today with c/o left side back pain x 10 days. States that i get a lot of urinary infections about 3 infection this year. Taking OCP daily  Lmp 02/26/08   Pt said that was hospitalized in January x 3 days for UTI  C/o pain and burning and frequency on urination no blood in urine. No n/v no chills no fever  Poc

## 2008-03-06 LAB — URINE CULTURE/COLONY COUNT

## 2008-03-11 ENCOUNTER — Encounter (HOSPITAL_BASED_OUTPATIENT_CLINIC_OR_DEPARTMENT_OTHER): Payer: Self-pay

## 2008-03-13 NOTE — Progress Notes (Signed)
c/c: dysuria, pelvic pain, for possible kidney sotne, B flank pian, +   klepsiena pneumonia UTI, renal ultrasound ordered, no rebound, no   guaridng , no fever, no chill     HPI:Claudia Lawrence is a 32 year old female      review of system  constitutional: NEG  HEENT:NEG  Cardiac: NEG  Resp:NEG  GI:NEG  GU: dysuria, left flank pian, + UTI   Endocrine:NEG  Skin:NEG  Neuro:NEG  Extrmities: NEG  Psych: NEG      Review of Patient's Allergies indicates:  No Known Allergies.    Current outpatient prescriptions:  OTHER MEDICATION, Diclofenac 75 mg one tab by mouth every 12 hours with   food for 7 days then every 12 hours as needed for pain , Disp: 30, Rfl:   0<BR>OMEPRAZOLE 20 MG OR TBEC, 1 TABLET DAILY at morning before alll   medication to prevent gastrititis , Disp: 30, Rfl: 3<BR>ORTHO TRI-CYCLEN   LO 0.025 MG OR TABS, 1 TABLET DAILY, Disp: 6 packs, Rfl: 2        Filed Vitals:   ----------------------       03/04/2008        3:00 PM     ----------------------     BP:   100/70      Pulse:   77      Temp:   98 F (36*      TempSrc:  Temporal      Weight:   156 lb (7*      SpO2:   99%     ----------------------     PHYSICAL EXAMINATION      GENERAL: awake and alert and orient timex 3 , distress due to pain   Thyroid: Nontender, no mass  RESP: Clear to ausc. percussion, no cracle / rale / wheeze  CARDIAC: NL PMI, normal s1,s2 no M/R/G   2+ radial, FEM, Distal pulses bilaterally      ABD: Good BS, nontender, No scar, No hernia, no hepatomegaly, nondistension  + suprapubic tendenress, left flank pain, no rebound, no guaring   MUSC: normal tone, no C/C/E      Assesment and Plan:  Claudia Lawrence is a 32 year old female      70.0A Kidney Stone (primary encounter diagnosis)  Comment:    Plan: URINE DIP (POINT OF CARE), URINE CULTURE/COLONY   COUNT, REFERRAL TO UROLOGY (INT), COMPREHENSIVE   METABOLIC PANEL, COMPLETE CBC W/AUTO DIFF WBC       599.0C UTI (Urinary Tract Infection)  Comment:   Plan: URINE DIP (POINT OF CARE), URINE  CULTURE/COLONY   COUNT, REFERRAL TO UROLOGY (INT), CIPROFLOXACIN   HCL 500 MG OR TABS, OTHER MEDICATION,    OMEPRAZOLE 20 MG OR TBEC, ROUTINE VENIPUNCTURE,   COMPREHENSIVE METABOLIC PANEL, COMPLETE CBC    W/AUTO DIFF WBC       789.00AL Left Flank Pain  Comment: renal ultrasound ordered  Plan: URINE DIP (POINT OF CARE), URINE CULTURE/COLONY   COUNT, REFERRAL TO UROLOGY (INT), COMPREHENSIVE   METABOLIC PANEL, COMPLETE CBC W/AUTO DIFF WBC,    BLOOD CULTURE, ADULT       625.9X Pelvic Pain in Female  Comment:   Plan: URINE DIP (POINT OF CARE), URINE CULTURE/COLONY   COUNT, REFERRAL TO UROLOGY (INT), COMPREHENSIVE   METABOLIC PANEL, COMPLETE CBC W/AUTO DIFF WBC             FOLLOW UP WITH DR. Kathie Rhodes Lodema Parma IN CLINIC WITH RESULT IN 4 WEEKS.   PATIENT  AGREES

## 2008-03-23 ENCOUNTER — Ambulatory Visit (HOSPITAL_BASED_OUTPATIENT_CLINIC_OR_DEPARTMENT_OTHER): Payer: Medicaid Other

## 2008-03-23 DIAGNOSIS — N390 Urinary tract infection, site not specified: Principal | ICD-10-CM

## 2008-03-23 DIAGNOSIS — R102 Pelvic and perineal pain unspecified side: Secondary | ICD-10-CM

## 2008-03-23 DIAGNOSIS — R109 Unspecified abdominal pain: Secondary | ICD-10-CM

## 2008-03-23 DIAGNOSIS — N200 Calculus of kidney: Secondary | ICD-10-CM

## 2008-03-23 DIAGNOSIS — R10A2 Flank pain, left side: Secondary | ICD-10-CM

## 2008-03-23 LAB — COMPREHENSIVE METABOLIC PANEL
ALANINE AMINOTRANSFERASE: 14 IU/L (ref 7–35)
ALBUMIN: 3.8 g/dl (ref 3.4–4.8)
ALKALINE PHOSPHATASE: 32 IU/L (ref 25–106)
ANION GAP: 5 mmol/L (ref 2–25)
ASPARTATE AMINOTRANSFERASE: 17 IU/L (ref 8–34)
BILIRUBIN TOTAL: 0.5 mg/dl (ref 0.2–1.1)
BUN (UREA NITROGEN): 10 mg/dl (ref 6–20)
CALCIUM: 9.1 mg/dl (ref 8.6–10.3)
CARBON DIOXIDE: 27 mmol/L (ref 22–32)
CHLORIDE: 104 mmol/L (ref 101–111)
CREATININE: 0.6 mg/dl (ref 0.4–1.2)
ESTIMATED GLOMERULAR FILT RATE: >60 mL/min (ref 60–116)
Glucose Random: 92 mg/dl (ref 74–160)
POTASSIUM: 4.6 mmol/L (ref 3.5–5.1)
SODIUM: 136 mmol/L (ref 135–144)
TOTAL PROTEIN: 6.9 g/dl (ref 5.9–7.5)

## 2008-03-23 LAB — BLOOD COUNT COMPLETE AUTO&AUTO DIFRNTL WBC
BASOPHIL %: 0.2 % (ref 0.0–2.0)
EOSINOPHIL %: 1.5 % (ref 0.0–7.0)
HEMATOCRIT: 36.5 % (ref 36.0–48.0)
HEMOGLOBIN: 12.4 g/dl (ref 12.0–16.0)
LYMPHOCYTE %: 23.1 % (ref 13.0–39.0)
MEAN CORP HGB CONC: 34.1 g/dl (ref 32.0–36.0)
MEAN CORPUSCULAR HGB: 27.1 pg (ref 27.0–33.0)
MEAN CORPUSCULAR VOL: 79.5 fl — ABNORMAL LOW (ref 80.0–100.0)
MEAN PLATELET VOLUME: 8.6 fl (ref 6.4–10.8)
MONOCYTE %: 5.7 % (ref 1.0–12.0)
NEUTROPHIL %: 69.5 % (ref 46.0–79.0)
PLATELET COUNT: 363 10*3/uL (ref 150–400)
RBC DISTRIBUTION WIDTH: 13.5 % (ref 11.5–14.3)
RED BLOOD CELL COUNT: 4.59 M/uL (ref 4.50–5.10)
WHITE BLOOD CELL COUNT: 8.4 10*3/uL (ref 4.0–10.8)

## 2008-03-23 NOTE — Progress Notes (Signed)
Pt sent to out patient lab

## 2008-03-29 LAB — BLOOD CULTURE ADULT
AEROBIC BOTTLE, BLOOD CULTURE: NO GROWTH
ANAEROBIC BTL, BLOOD CULTURE: NO GROWTH

## 2008-03-31 ENCOUNTER — Ambulatory Visit (HOSPITAL_BASED_OUTPATIENT_CLINIC_OR_DEPARTMENT_OTHER): Payer: Self-pay | Admitting: Dentistry

## 2008-05-20 ENCOUNTER — Ambulatory Visit (HOSPITAL_BASED_OUTPATIENT_CLINIC_OR_DEPARTMENT_OTHER): Payer: Medicaid Other | Admitting: Urology

## 2008-07-10 ENCOUNTER — Ambulatory Visit (HOSPITAL_BASED_OUTPATIENT_CLINIC_OR_DEPARTMENT_OTHER): Payer: Medicaid Other

## 2008-07-10 LAB — URINE PREGNANCY TEST (POINT OF CARE): HCG QUALITATIVE URINE: NEGATIVE

## 2008-07-10 NOTE — Progress Notes (Signed)
Pt here for annual    Pt thought she was pregnant last week b/c N/V; uhcg was neg  Had some nausea yesterday but better  LMP 3/28    FamHx:  Mat aunt with endometrial cancer    PMH: no change    GynHx:  Reg menses  Same partner  On OC's  Going to try to get pregnant after these results back    GEN: NAD  THYROID: NO TM  CV:RRR  BREAST: symm, no mass, no d/c  RESP: CTAB  ABD: soft, NT, ND  EFG: NEFG  SSE: Large ectropion; small blood after pap; no lesions  SVE: small AV uterus, mobile, NT;no adnexal mass/tenderness    A/P  1)HM: pap/cx            SBE  2)preconception: folic acid  3)FP: cont OC's for now

## 2008-07-14 ENCOUNTER — Encounter (HOSPITAL_BASED_OUTPATIENT_CLINIC_OR_DEPARTMENT_OTHER): Payer: Self-pay

## 2008-07-14 ENCOUNTER — Ambulatory Visit (HOSPITAL_BASED_OUTPATIENT_CLINIC_OR_DEPARTMENT_OTHER): Payer: Medicaid Other

## 2008-07-14 VITALS — BP 100/70 | HR 64 | Temp 98.4°F | Wt 158.0 lb

## 2008-07-14 DIAGNOSIS — F3289 Other specified depressive episodes: Secondary | ICD-10-CM | POA: Insufficient documentation

## 2008-07-14 DIAGNOSIS — R519 Headache, unspecified: Secondary | ICD-10-CM

## 2008-07-14 DIAGNOSIS — R51 Headache: Secondary | ICD-10-CM

## 2008-07-14 DIAGNOSIS — G43909 Migraine, unspecified, not intractable, without status migrainosus: Principal | ICD-10-CM

## 2008-07-14 DIAGNOSIS — F329 Major depressive disorder, single episode, unspecified: Secondary | ICD-10-CM

## 2008-07-14 DIAGNOSIS — F419 Anxiety disorder, unspecified: Secondary | ICD-10-CM

## 2008-07-14 DIAGNOSIS — F32A Depression, unspecified: Secondary | ICD-10-CM

## 2008-07-14 DIAGNOSIS — F410 Panic disorder [episodic paroxysmal anxiety] without agoraphobia: Secondary | ICD-10-CM

## 2008-07-14 LAB — CHLAMYDIA GC NAAT
GENPROBE CHLAMYDIA: NEGATIVE
GENPROBE GC: NEGATIVE

## 2008-07-14 MED ORDER — CLONAZEPAM 0.5 MG PO TABS
ORAL_TABLET | ORAL | Status: DC
Start: 2008-07-14 — End: 2008-08-14

## 2008-07-14 MED ORDER — AMITRIPTYLINE HCL 10 MG PO TABS
ORAL_TABLET | ORAL | Status: DC
Start: 2008-07-14 — End: 2008-08-14

## 2008-07-14 MED ORDER — SERTRALINE HCL 25 MG PO TABS
ORAL_TABLET | ORAL | Status: DC
Start: 2008-07-14 — End: 2008-08-14

## 2008-07-14 MED ORDER — EXCEDRIN MIGRAINE 250-250-65 MG PO TABS
ORAL_TABLET | ORAL | Status: DC
Start: 2008-07-14 — End: 2008-08-14

## 2008-07-14 MED ORDER — BUTALBITAL-APAP-CAFFEINE 50-325-40 MG PO TABS
ORAL_TABLET | ORAL | Status: AC
Start: 2008-07-14 — End: 2008-08-13

## 2008-07-15 LAB — HUMAN PAPILLOMAVIRUS (HPV): HUMAN PAPILLOMAVIRUS: NEGATIVE

## 2008-07-19 NOTE — Progress Notes (Signed)
c/c: worsening migrain HA t started butabital and exedrin migrain,   encourage to daily exercise and keep a migrian journal . no fever, no   chill, no neck pain, no visual aura noted, + photophbia , + n/V with   migrian attack. neurology consultaiton ordered    hat is worsneing with recent anxiety/panic attack/depression /insomnia ,   PHQ9 score 8 , started zolfot 25 mg , denied suicidal and safety concerns,   psych consultaiotn ordered     HPI:Claudia Lawrence is a 33 year old female      review of system  constitutional: distress due to mirgrin HA  HEENT: allergy sinusits, migrain HA  Cardiac: NEG  Resp:NEG  GI:NEG  GU:NEG  Endocrine:NEG  Skin:NEG  Neuro:NEG  Extrmities: NEG  Psych: anxiety/panic attack/inosmnia       Review of Patient's Allergies indicates:  No Known Allergies.    Current outpatient prescriptions:  BUTALBITAL-APAP-CAFFEINE 50-325-40 MG OR TABS, 1 OR 2 TABLETS EVERY 4   HOURS AS NEEDED for migrain HA ( severe migrain ) , Disp: 28, Rfl:   0<BR>EXCEDRIN MIGRAINE 250-250-65 MG OR TABS, 2 TABLETS EVERY 6 HOURS AS   NEEDED fo migrian HA , Disp: 40 , Rfl: 3<BR>AMITRIPTYLINE HCL 10 MG OR   TABS, 1 TABLET by mouth at bed time ( to prevent migrain HA ) stared   after completion of clonazapam ) , Disp: 30 , Rfl: 3  CLONAZEPAM 0.5 MG OR TABS, 1 TABLET By mouth at bed time for 7 nights (   For anxiety/depression /apnic attack ) , Disp: 10 , Rfl: 0<BR>SERTRALINE   HCL 25 MG OR TABS, 1 -2 TABLETs DAILY ( for anxiety/depression , not   addictve to the brain ) , Disp: 60 , Rfl: 6        Filed Vitals:  ----------------------    07/14/2008     10:00 AM   ----------------------   BP:  100/70    Pulse:  64    Temp:  98.4 F (*    TempSrc: Temporal    Weight:  158 lb (7*   ----------------------    PHYSICAL EXAMINATION      GENERAL: awake and alert and orient timex 3 , anxious mood   HENNT: EMOI, PERRL, no oral thrush, + one side HA , with photophobia   during attack, no erythema at phyanx, good  hearing acuity intacy,   tampanic membrance intact  NECK: synnetruc, no Mass, no JVD, range to neck intact  Thyroid: Nontender, no mass  RESP: Clear to ausc. percussion, no cracle / rale / wheeze  CARDIAC: NL PMI, normal s1,s2 no M/R/G   2+ radial, FEM, Distal pulses bilaterally    ABD: Good BS, nontender, No scar, No hernia, no hepatomegaly, nondistension      Psych: Normal Insight and affect, + anxiety/depression Joeseph Amor /panic   attack      Assesment and Plan:    Claudia Lawrence is a 33 year old female      29.90A Migraine (primary encounter diagnosis)  Comment:   Plan: BUTALBITAL-APAP-CAFFEINE 50-325-40 MG OR TABS,    EXCEDRIN MIGRAINE 250-250-65 MG OR TABS,    AMITRIPTYLINE HCL 10 MG OR TABS, REFERRAL TO    NEUROLOGY (INT)       784.0AB HA (Headache)  Comment:   Plan:     300.00E Anxiety  Comment:   Plan: SERTRALINE HCL 25 MG OR TABS, REFERRAL TO ADULT   PSYCHIATRY (INTERNAL)  300.01P Panic Attack  Comment:   Plan: CLONAZEPAM 0.5 MG OR TABS, REFERRAL TO ADULT    PSYCHIATRY (INTERNAL)       311F Depression  Comment:     Plan: SERTRALINE HCL 25 MG OR TABS, REFERRAL TO ADULT   PSYCHIATRY (INTERNAL)           FOLLOW UP WITH DR. Kathie Rhodes Marinda Tyer IN CLINIC WITH RESULT IN 4 WEEKS.   PATIENT AGREES

## 2008-07-20 LAB — CYTOPATH, C/V, THIN LAYER

## 2008-07-22 ENCOUNTER — Other Ambulatory Visit (HOSPITAL_BASED_OUTPATIENT_CLINIC_OR_DEPARTMENT_OTHER): Payer: Self-pay

## 2008-07-29 ENCOUNTER — Encounter (HOSPITAL_BASED_OUTPATIENT_CLINIC_OR_DEPARTMENT_OTHER): Payer: Self-pay

## 2008-07-29 ENCOUNTER — Telehealth (HOSPITAL_BASED_OUTPATIENT_CLINIC_OR_DEPARTMENT_OTHER): Payer: Self-pay

## 2008-08-04 ENCOUNTER — Telehealth (HOSPITAL_BASED_OUTPATIENT_CLINIC_OR_DEPARTMENT_OTHER): Payer: Self-pay

## 2008-08-05 ENCOUNTER — Telehealth (HOSPITAL_BASED_OUTPATIENT_CLINIC_OR_DEPARTMENT_OTHER): Payer: Self-pay

## 2008-08-05 NOTE — Telephone Encounter (Signed)
Pt returned SW call. Pt says that she went twice to [facility] [address] in [place] to pick up medication, but pharmacy says that they have no record of the order.    Pt now asks that Dr. Nedra Hai order all medication at [facility] pharmacy as pt has free care. Her husband can pick it up a week from today.    SW will ask Dr. Nedra Hai to order all medication for pickup at [facility] pharmacy.

## 2008-08-14 ENCOUNTER — Telehealth (HOSPITAL_BASED_OUTPATIENT_CLINIC_OR_DEPARTMENT_OTHER): Payer: Self-pay

## 2008-08-14 ENCOUNTER — Ambulatory Visit (HOSPITAL_BASED_OUTPATIENT_CLINIC_OR_DEPARTMENT_OTHER): Payer: Medicaid Other

## 2008-08-14 VITALS — BP 102/60 | HR 99 | Temp 98.6°F | Wt 158.0 lb

## 2008-08-14 DIAGNOSIS — R059 Cough, unspecified: Secondary | ICD-10-CM

## 2008-08-14 DIAGNOSIS — F419 Anxiety disorder, unspecified: Secondary | ICD-10-CM

## 2008-08-14 DIAGNOSIS — R05 Cough: Secondary | ICD-10-CM

## 2008-08-14 DIAGNOSIS — K029 Dental caries, unspecified: Secondary | ICD-10-CM

## 2008-08-14 DIAGNOSIS — F329 Major depressive disorder, single episode, unspecified: Secondary | ICD-10-CM

## 2008-08-14 DIAGNOSIS — L089 Local infection of the skin and subcutaneous tissue, unspecified: Secondary | ICD-10-CM

## 2008-08-14 DIAGNOSIS — F32A Depression, unspecified: Secondary | ICD-10-CM

## 2008-08-14 DIAGNOSIS — S00521A Blister (nonthermal) of lip, initial encounter: Secondary | ICD-10-CM

## 2008-08-14 DIAGNOSIS — K047 Periapical abscess without sinus: Secondary | ICD-10-CM

## 2008-08-14 DIAGNOSIS — J029 Acute pharyngitis, unspecified: Secondary | ICD-10-CM

## 2008-08-14 DIAGNOSIS — J329 Chronic sinusitis, unspecified: Principal | ICD-10-CM

## 2008-08-14 DIAGNOSIS — R42 Dizziness and giddiness: Secondary | ICD-10-CM

## 2008-08-14 LAB — BLOOD COUNT COMPLETE AUTO&AUTO DIFRNTL WBC
BASOPHIL %: 0.3 % (ref 0.0–2.0)
EOSINOPHIL %: 4 % (ref 0.0–7.0)
HEMATOCRIT: 38.2 % (ref 36.0–48.0)
HEMOGLOBIN: 13.1 g/dl (ref 12.0–16.0)
LYMPHOCYTE %: 22 % (ref 13.0–39.0)
MEAN CORP HGB CONC: 34.4 g/dl (ref 32.0–36.0)
MEAN CORPUSCULAR HGB: 27.6 pg (ref 27.0–33.0)
MEAN CORPUSCULAR VOL: 80.3 fl (ref 80.0–100.0)
MEAN PLATELET VOLUME: 8 fl (ref 6.4–10.8)
MONOCYTE %: 9 % (ref 1.0–12.0)
NEUTROPHIL %: 64.7 % (ref 46.0–79.0)
PLATELET COUNT: 363 10*3/uL (ref 150–400)
RBC DISTRIBUTION WIDTH: 13.4 % (ref 11.5–14.3)
RED BLOOD CELL COUNT: 4.76 M/uL (ref 4.50–5.10)
WHITE BLOOD CELL COUNT: 8.4 10*3/uL (ref 4.0–10.8)

## 2008-08-14 LAB — URINE PREGNANCY TEST (POINT OF CARE): HCG QUALITATIVE URINE: POSITIVE

## 2008-08-14 LAB — RAPID STREP (POINT OF CARE): STREP SCREEN: NEGATIVE

## 2008-08-14 MED ORDER — BACTRIM DS 800-160 MG PO TABS
ORAL_TABLET | ORAL | Status: DC
Start: 2008-08-14 — End: 2008-08-14

## 2008-08-14 MED ORDER — SERTRALINE HCL 25 MG PO TABS
ORAL_TABLET | ORAL | Status: DC
Start: 2008-08-14 — End: 2008-08-14

## 2008-08-14 MED ORDER — ABREVA 10 % EX CREA
TOPICAL_CREAM | CUTANEOUS | Status: DC
Start: 2008-08-14 — End: 2008-08-14

## 2008-08-14 MED ORDER — ORTHO-CYCLEN (28) 0.25-35 MG-MCG PO TABS
ORAL_TABLET | ORAL | Status: DC
Start: 2008-08-14 — End: 2008-08-14

## 2008-08-14 MED ORDER — AUGMENTIN 500-125 MG PO TABS
ORAL_TABLET | ORAL | Status: DC
Start: 2008-08-14 — End: 2008-08-19

## 2008-08-14 MED ORDER — MEDROL (PAK) 4 MG PO TABS
ORAL_TABLET | ORAL | Status: DC
Start: 2008-08-14 — End: 2008-08-14

## 2008-08-14 MED ORDER — OCEAN ULTRA SALINE MIST NA SOLN
NASAL | Status: DC
Start: 2008-08-14 — End: 2008-08-19

## 2008-08-14 MED ORDER — FLONASE 50 MCG/ACT NA SUSP
NASAL | Status: DC
Start: 2008-08-14 — End: 2008-08-14

## 2008-08-14 MED ORDER — PRENATAL 1 PLUS 1 OR TABS
ORAL_TABLET | ORAL | Status: DC
Start: 2008-08-14 — End: 2008-08-19

## 2008-08-14 NOTE — Telephone Encounter (Signed)
Dear Claudia Lawrence    I did call the patient's husband , + urinary pregnancy  Test, advise to stop all the medicaitons as presribed. Chante to     Augmentum 500 mg by mouth bid for 7 days for  7 days, prenatal vitamine, and nasal salline spray or sinusitis, and cough, prenatal blood work  And  OB  consultaiton ordered,   Pateint will come Monday for prenatal blood work    Thank you

## 2008-08-16 LAB — THROAT CULTURE BETA STREP

## 2008-08-17 LAB — WOUND CULTURE AND GRAM STAIN

## 2008-08-17 NOTE — Progress Notes (Addendum)
s:Claudia Lawrence is a 33 year old female  follow up with anxiety/depression on celexa, today, c/c fatigue ,   worsening allergy sinus congestion, , throat pain, + ear pain and vertigo   when changing position, to make matter worse , pateint also painful dental   cavity at left upper molar with infected gum and swelling, pateint was   told need to [facility] oral surgery consultation as per [facility] genearl dentis's   recommendation    +incidetnal postive urine pregnancy test, I did call patient to stop   ocp , all medication, I did change antibitoic to Augmentum 500 mg bid for   7 days , saline ocean spray, prenatal vitamin . I did phone patient 's   husband regarding to prenatal blodd work , and OB ultrasound         Review of Patient's Allergies indicates:  No Known Allergies.      Current outpatient prescriptions:  PRENATAL 1 PLUS 1 OR TABS, 1 TABLET DAILY ( prenatal vitamine ), Disp: 30,   Rfl: 9<BR>AUGMENTIN 500-125 MG OR TABS, 1 TABLET EVERY 12 HOURS for 7 days   ( antibtioic for sinus , ear , throat infection , patient is pregnant ),   Disp: 28, Rfl: 0<BR>OCEAN ULTRA SALINE MIST NA SOLN, One nasal srpay to   each nosril twice daily ( saline nasal spray , patient is pregnant ),   Disp: 1, Rfl: 0        Filed Vitals:   ----------------------       08/14/2008        10:17 AM     ----------------------     BP:   102/60      Pulse:   99      Temp:   98.6 F (*      TempSrc:  Temporal      Weight:   158 lb (7*      SpO2:   100%     ----------------------           PHYSICAL EXAMINATION      GENERAL: awake and alert and orient timex 3 , fatigue   HENNT: EMOI, PERRL, + nasal mucosa erythema and congestion, no oral   thrush, + erythema at phyanx, + intermittent vertigo , good hearing   acuity intacy, tampanic membrance intact  NECK: synnetruc, no Mass, no JVD, range to neck intact  Thyroid: Nontender, no mass  RESP: intermittent dry cough, Clear to ausc. percussion, no cracle /   rale / wheeze  CARDIAC: NL PMI, normal s1,s2 no M/R/G    2+ radial, FEM, Distal pulses bilaterally      ABD: Good BS, nontender, No scar, No hernia, no hepatomegaly, nondistension  MUSC: normal tone, no C/C/E  Skin: No rash, lesions ulers  Nero: CN 2-12 intact, no focal defecit  GYN: no vaginal discharge, no lesion at vulva area, cervical oss is   closed, moist, pink in color  Psych: Normal Insight and affect  Lymph: no cervical, no supraclavicular, no axillary, an inginal lymph node   enlargement  EXTREMITIES; no pitting edema , no joint swelling or tender, or readness        Assesment and Plan:    Claudia Lawrence is a 33 year old female      473.9E Sinusitis (primary encounter diagnosis)  Comment:   Plan: ROUTINE VENIPUNCTURE, COMPLETE CBC W/AUTO DIFF    WBC, AUGMENTIN 500-125 MG OR TABS, OCEAN ULTRA    SALINE MIST NA SOLN  300.00E Anxiety  Comment: hold all medication due to pregnancy   Plan:     311F Depression  Comment:   Plan:     910.3E Blister of Lip with Infection  Comment:         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.09C Complex Dental Caries  Comment:   Plan:       522.5B Dental Abscess  Comment:    Plan: REFERRAL TO ORAL SURGERY (EXT), WOUND CULTURE    AND GRAM STAIN, AUGMENTIN 500-125 MG OR TABS         780.4K Vertigo  Comment: due to vestibular neuritis vs labrynitis     Plan: URINE PREGNANCY TEST (POINT OF CARE), ROUTINE    VENIPUNCTURE, COMPLETE CBC W/AUTO DIFF WBC       786.2 Cough  Comment:     Plan:    V22.2AB Pregnancy Confirmed  Comment:   Plan: URINE PREGNANCY TEST (POINT OF CARE), PRENATAL    1 PLUS 1 OR TABS, REFERRAL TO OB/GYN (INT),    TREPONEMA PALLIDUM ANTIBODY (RPR), HEP B    SURFACE ANTIGEN, URINALYSIS, URINE    CULTURE/COLONY COUNT, HCG QUANTITATIVE, RUBELLA   (TITER)         462AL Throat Infection  Comment:    Plan: RAPID STREP (POINT OF CARE), THROAT CULTURE,    BETA STREP           FOLLOW UP WITH DR. Kathie Rhodes Treyvon Blahut IN CLINIC WITH RESULT IN 4 WEEKS.   PATIENT AGREES

## 2008-08-18 ENCOUNTER — Telehealth (HOSPITAL_BASED_OUTPATIENT_CLINIC_OR_DEPARTMENT_OTHER): Payer: Self-pay

## 2008-08-18 ENCOUNTER — Ambulatory Visit (HOSPITAL_BASED_OUTPATIENT_CLINIC_OR_DEPARTMENT_OTHER): Payer: Medicaid Other

## 2008-08-18 LAB — URINALYSIS
BACTERIA: <10 PER HPF — AB (ref 0–?)
BILIRUBIN, URINE: NEGATIVE
CASTS: NONE SEEN PER LPF
CRYSTALS: NONE SEEN
GLUCOSE, URINE: NEGATIVE MG/DL
KETONE, URINE: NEGATIVE MG/DL
NITRITE, URINE: NEGATIVE
OCCULT BLOOD, URINE: NEGATIVE
PH URINE: 6 (ref 5.0–8.0)
PROTEIN, URINE: NEGATIVE MG/DL
RED BLOOD CELLS URINE: NONE SEEN PER HPF (ref 0–2)
SPECIFIC GRAVITY URINE: 1.01 (ref 1.003–1.035)

## 2008-08-18 NOTE — Progress Notes (Signed)
.  Labs drawn 2sst, 1 u\a,1 u\c

## 2008-08-18 NOTE — Telephone Encounter (Signed)
Staff Message copied by Simonne Martinet on Tue Aug 18, 2008 1:45 PM  ------   Message from: Westly Pam, [place]   Created: Mon Aug 17, 2008 3:12 PM   Regarding: FW: Prenatal appt   Contact: [phone]      ----- Message -----   From: Larena Glassman   Sent: Aug 17, 2008 3:08 PM   To: Hubbard Hartshorn Pool  Subject: Prenatal appt     [facility]    [place] [id number], 33 year old, female, Telephone Information:  Home Phone [phone]  Work Phone [phone]  Mobile [phone]      Cleotis Lema NUMBER: [phone]  Best time to call back:   Cell phone:   Other phone:    Available times:    Patient's language of care: [place]    Patient does not need an interpreter.    Patient's PCP: Eileen Stanford, MD    Person calling on behalf of patient: Patient (self)    Calls today   Pt saw Dr.Lee last Friday and had positive preg test,was told to call today and make an appt w/ Dr.Domingues.    Patient's Preferred Pharmacy:   [facility] [address] [facility]  Phone: [phone] Fax: [phone]

## 2008-08-18 NOTE — Telephone Encounter (Signed)
Called pt and l/m with appt for New Prenatal intake with Adelina on 09/14/08 at 3:00 pm and with Dr.Domingues for a partial new prenatal on 10/08/08 at 2:45 pm.

## 2008-08-19 ENCOUNTER — Telehealth (HOSPITAL_BASED_OUTPATIENT_CLINIC_OR_DEPARTMENT_OTHER): Payer: Self-pay

## 2008-08-19 ENCOUNTER — Other Ambulatory Visit (HOSPITAL_BASED_OUTPATIENT_CLINIC_OR_DEPARTMENT_OTHER): Payer: Self-pay | Admitting: Registered Nurse

## 2008-08-19 DIAGNOSIS — K047 Periapical abscess without sinus: Principal | ICD-10-CM

## 2008-08-19 DIAGNOSIS — J329 Chronic sinusitis, unspecified: Secondary | ICD-10-CM

## 2008-08-19 MED ORDER — AUGMENTIN 500-125 MG PO TABS
ORAL_TABLET | ORAL | Status: AC
Start: 2008-08-19 — End: 2008-09-02

## 2008-08-19 MED ORDER — OCEAN ULTRA SALINE MIST NA SOLN
NASAL | Status: AC
Start: 2008-08-19 — End: 2008-09-19

## 2008-08-19 MED ORDER — PRENATAL 1 PLUS 1 OR TABS
ORAL_TABLET | ORAL | Status: DC
Start: 2008-08-19 — End: 2009-11-25

## 2008-08-19 MED ORDER — LORATADINE 10 MG PO TABS
ORAL_TABLET | ORAL | Status: DC
Start: 2008-08-19 — End: 2008-08-19

## 2008-08-19 MED ORDER — AUGMENTIN 500-125 MG PO TABS
ORAL_TABLET | ORAL | Status: DC
Start: 2008-08-19 — End: 2008-08-19

## 2008-08-19 NOTE — Telephone Encounter (Signed)
Dear RNs    I did phoned the patient regarding serum hcg about 3-[redacted] weeks pregnant,    Advise pateint to pick up the antibtioic for sinus and throat infeciton, patient c/c wosening of sinus congestion and throat pain.     The husband requested to sent all medicaiton to [facility] pharamcy, but he will pick up antibtioic  At [facility] at [facility] which sent eletronically last weekl    [name]

## 2008-08-19 NOTE — Telephone Encounter (Signed)
THANK YOU SARA  OR JUDY  , I DID AUTHORIZED IT.      [employer]

## 2008-08-19 NOTE — Telephone Encounter (Signed)
Staff Message copied by Olevia Bowens on Wed Aug 19, 2008 3:52 PM  ------   Message from: Onnie Graham   Created: Wed Aug 19, 2008 12:53 PM   Regarding: Medication clarification   Contact: [phone]    [facility]    [place] [id number], 33 year old, female, Telephone Information:  Home Phone [phone]  Work Phone [phone]  Mobile [phone]      Cleotis Lema NUMBER: [phone]  Best time to call back: Anytime  Cell phone:   Other phone:    Available times:    Patient's language of care: [place]    Patient needs a [place] interpreter.    Patient's PCP: Eileen Stanford, MD    Person calling on behalf of patient: Spouse    Spouse called on behalf of pt. He said she is pregnant and having bad allergy due to pollen. 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 Spouse would also like to have the antibiotic prescribed for pt to be picked up at [facility], but he doesn't remember the name of the medicine.    Thanks    Patient's Preferred Pharmacy:   [facility]  Phone: [phone] Fax: [phone]

## 2008-08-19 NOTE — Telephone Encounter (Signed)
Dr Nedra Hai:  Patient asking for script to go to [facility] pharmacy.  Also asking for "allergy meds". Is Claritin ok?  Thanks!

## 2008-08-20 LAB — HEPATITIS B SURFACE ANTIGEN: HEPATITIS B SURFACE ANTIGEN: NONREACTIVE

## 2008-08-20 LAB — TREPONEMA PALLIDUM AB IGG: TREPONEMA PALLIDUM AB IgG: REACTIVE — AB

## 2008-08-20 LAB — RPR: RPR: NONREACTIVE

## 2008-08-20 LAB — URINE CULTURE/COLONY COUNT

## 2008-08-20 LAB — RUBELLA IGG ANTIBODY: RUBELLA: 158 IU/mL — ABNORMAL HIGH (ref 0–15)

## 2008-08-20 LAB — HCG QUANTITATIVE: HCG QUANTITATIVE: 3296 m[IU]/mL — ABNORMAL HIGH (ref 1–3)

## 2008-09-04 ENCOUNTER — Telehealth (HOSPITAL_BASED_OUTPATIENT_CLINIC_OR_DEPARTMENT_OTHER): Payer: Self-pay

## 2008-09-04 NOTE — Telephone Encounter (Signed)
Reports she is drinking & urinating OK,apt scheduled for Monday with RN to evaulate, per [place].   Advised OK to take OTC allergy med, Loratidine or Zyrtec, continue drinking fluida & eat dry crackers.  The patient indicates understanding of these issues and agrees with the plan.

## 2008-09-04 NOTE — Telephone Encounter (Signed)
Staff Message copied by Dineen Kid on Fri Sep 04, 2008 9:17 AM  ------   Message from: Larena Glassman   Created: Fri Sep 04, 2008 8:54 AM   Regarding: P/n pt nausea and allergies   Contact: [phone]    [facility]    [place] [id number], 33 year old, female, Telephone Information:  Home Phone [phone]  Work Phone [phone]  Mobile [phone]      Cleotis Lema NUMBER: [phone]  Best time to call back:   Cell phone:   Other phone:    Available times:    Patient's language of care: [place]    Patient does not need an interpreter.    Patient's PCP: Eileen Stanford, MD    Person calling on behalf of patient: Patient (self)    Calls today   P/n pt nausea and allergies    Patient's Preferred Pharmacy:   [facility]  Phone: [phone] Fax: [phone]    [facility] ([facility])  Phone: [phone] Fax: [phone]

## 2008-09-04 NOTE — Telephone Encounter (Signed)
Pt's husband translating for her.  C\O seasonal allergies with nasal congestion & difficulty sleeping.  Unable to keep food or fluids down, vomiting several times daily.   Pt is unable to keep down prenatal vits.  Pt is 5-6 wks pregant,  F\U with Lina & Dr. Vilinda Blanks pending.  Will check with on-site OB provider for plan.  The patient indicates understanding of these issues and agrees with the plan.  Pt uses [facility].

## 2008-09-07 ENCOUNTER — Ambulatory Visit (HOSPITAL_BASED_OUTPATIENT_CLINIC_OR_DEPARTMENT_OTHER): Payer: Medicaid Other

## 2008-09-07 DIAGNOSIS — O210 Mild hyperemesis gravidarum: Secondary | ICD-10-CM

## 2008-09-07 LAB — URINE DIP (POINT OF CARE)
BILIRUBIN, URINE: NEGATIVE (ref 0–0)
GLUCOSE, URINE: NEGATIVE mg/dl (ref 0–0)
KETONE, URINE: NEGATIVE mg/dl (ref 0–0)
NITRITE, URINE: NEGATIVE
OCCULT BLOOD, URINE: NEGATIVE (ref 0–0)
PH URINE: 7 (ref 5.0–8.0)
PROTEIN, URINE: NEGATIVE mg/dl (ref 0–15)
SPECIFIC GRAVITY URINE: 1.015 (ref 1.003–1.030)
UROBILINOGEN URINE: 0.2 mg/dl (ref 0.2–1.0)

## 2008-09-07 MED ORDER — PROMETHAZINE HCL 25 MG PO TABS
ORAL_TABLET | ORAL | Status: DC
Start: 2008-09-07 — End: 2008-10-19

## 2008-09-07 MED ORDER — PROMETHAZINE HCL 25 MG PR SUPP
RECTAL | Status: AC
Start: 2008-09-07 — End: 2008-09-14

## 2008-09-07 NOTE — Progress Notes (Signed)
.  .  .        C\O N\V x 2 wks continuely.  Couple nervous D\T 3 miscarriages, C\O stabbing, intermittent LLQ pain comes & goes every week x 5 wks,lasts 1/2 hr. Vomiting  Frequently shortly after eating & drinking.  Can only tolerate water & ice chips, voiding QS, no problem with bowels.  Skin turgor good, voiding QS.    LMP 4\25\10.  Reviewed case with Pablo Ledger CNM.  Meds ordered, ready for pick up @ 3pm today, sooner F\U with MD arranged by Fara Boros D\T H\O complications.  Advised to drink flat gingerale,  Keep hydrated,start with suppositories then take pills for N\V.  Requesting Dr. Vilinda Blanks for OB care.   The patient indicates understanding of these issues and agrees with the plan.

## 2008-09-14 ENCOUNTER — Ambulatory Visit (HOSPITAL_BASED_OUTPATIENT_CLINIC_OR_DEPARTMENT_OTHER): Payer: Medicaid Other

## 2008-09-14 ENCOUNTER — Encounter (HOSPITAL_BASED_OUTPATIENT_CLINIC_OR_DEPARTMENT_OTHER): Payer: Self-pay

## 2008-09-14 DIAGNOSIS — Z7189 Other specified counseling: Principal | ICD-10-CM

## 2008-09-14 LAB — BLOOD COUNT COMPLETE AUTOMATED
HEMATOCRIT: 34.4 % — ABNORMAL LOW (ref 36.0–48.0)
HEMOGLOBIN: 11.8 g/dl — ABNORMAL LOW (ref 12.0–16.0)
MEAN CORP HGB CONC: 34.4 g/dl (ref 32.0–36.0)
MEAN CORPUSCULAR HGB: 27.6 pg (ref 27.0–33.0)
MEAN CORPUSCULAR VOL: 80.3 fl (ref 80.0–100.0)
MEAN PLATELET VOLUME: 8 fl (ref 6.4–10.8)
PLATELET COUNT: 284 10*3/uL (ref 150–400)
RBC DISTRIBUTION WIDTH: 13.4 % (ref 11.5–14.3)
RED BLOOD CELL COUNT: 4.28 M/uL — ABNORMAL LOW (ref 4.50–5.10)
WHITE BLOOD CELL COUNT: 7.9 10*3/uL (ref 4.0–10.8)

## 2008-09-14 LAB — TYPE AND SCREEN

## 2008-09-14 NOTE — Patient Instructions (Signed)
WHEN AND HOW TO CALL YOUR OB PROVIDER    WHEN?   If you have a sudden gush of fluid  If you are bleeding  If you are having contractions  If you don't feel the baby move  If you have any important questions    HOW?  During the office hours Monday through Friday 8:30am to 5:00pm call your clinic. The number is     After 5:00pm and on weekends call the OB/GYN/Midwifery Office at [phone]. This number will reach the 24 hour answering service when the office is closed. If you do not receive a call in 10-15 minutes call the answering service again.    If it is an emergency please call the [facility] at [phone] and ask them to page the Doctor/Midwife on call.  QUANDO E COMO LIGAR PARA O SEU OBSTETRA      QUANDO?    Se voc tiver uma perda repentina de fluido   Se voc estiver sangrando   Se voc estiver tendo contraes   Se voc no sentir o beb se mexer   Se voc tiver quaisquer perguntas importantes    COMO?  Durante o horrio comercial segunda  sexta 8:30 s 17:00 horas ligue para a Nature conservation officer. O nmero  [phone]   Aps as 17:00 horas e nos finais de semana ligue para o consultrio de ginecologia/obstetrcia/parteiras no nmero [phone].  Este nmero ir lhe conectar com o servio de atendimento 24 horas quando o consultrio Emergency planning/management officer. Se voc no receber uma ligao de volta em 10 ou 15 minutos ligue para o servio de atendimento Dietitian.  Se for uma emergncia por favor ligue para o [facility] no nmero [phone] e pea para eles enviarem um page para o mdico/parteira de planto.      WHEN AND HOW TO CALL YOUR OB PROVIDER    WHEN?   If you have a sudden gush of fluid  If you are bleeding  If you are having contractions  If you don't feel the baby move  If you have any important questions    HOW?  Contact the clinic where you receive your prenatal care. The number is [phone].    If you need to reach your provider after clinic hours or during the  weekend call the same number listed above. This number will reach the 24 hour answering service and you will ask to speak with the on-call family physician. If you do not receive a call back in 10-15 minutes, call [phone] again.

## 2008-09-14 NOTE — Progress Notes (Signed)
.  .  .    New Prenatal Visit  S:     complaints: feeling tired, O:    PHYSICAL EXAM:  Constitutional: well developed, well nourished [place] female    Extremities: normal  See prenatal flowsheet for weight, BP.  A:    Pregnancy at There is no pregnancy episode linked to this encounter.  EDD based on LMP    P:     - Routine prenatal labs ordered.  HIV counseling done.   - Discussed testing: genetic testing, AFP screen, U/S in pregnancy and amniocentesis   - Reviewed risks related to the environment, eating fish, raw milk, raw meat, cats, gardening and over heating.   - Warning signs reviewed: instructed to call for pain, bleeding, dysuria, increased nausea and vomiting and any other concerns.   - Reviewed usual schedule of visits and how to call office.   - Follow up: 3 days to see Dr. Vilinda Blanks   pt and husband verbalized instructions.  Reviewed call after 5:00pm   .

## 2008-09-16 ENCOUNTER — Ambulatory Visit (HOSPITAL_BASED_OUTPATIENT_CLINIC_OR_DEPARTMENT_OTHER): Payer: PRIVATE HEALTH INSURANCE

## 2008-09-16 VITALS — BP 92/60 | Wt 159.0 lb

## 2008-09-16 DIAGNOSIS — Z7189 Other specified counseling: Principal | ICD-10-CM

## 2008-09-16 DIAGNOSIS — O0990 Supervision of high risk pregnancy, unspecified, unspecified trimester: Principal | ICD-10-CM

## 2008-09-16 DIAGNOSIS — N3090 Cystitis, unspecified without hematuria: Secondary | ICD-10-CM

## 2008-09-16 LAB — URINE CULTURE/COLONY COUNT

## 2008-09-16 MED ORDER — ONDANSETRON HCL 8 MG PO TABS
ORAL_TABLET | ORAL | Status: DC
Start: 2008-09-16 — End: 2008-10-01

## 2008-09-16 MED ORDER — MACROBID 100 MG PO CAPS
ORAL_CAPSULE | ORAL | Status: AC
Start: 2008-09-16 — End: 2008-09-23

## 2008-09-16 NOTE — Progress Notes (Signed)
.  .  .        Pt here for IPN  Pt here with husband   Happy  No urinary sx's  No VB but ++ nausea; has been taking phenergan but did better with zofran    LMP 4/20  EGA 9 2/7 weeks    EXAM:  GEN: NAD  ABD: soft,Nt, ND  BAesiCK: no CVAT  EFG: NEFG  SSE: vagina clear; no lesions  SVE: 10 week size uterus; mobile, NT; no adnexal mass/tendernes; cervix closed/long/thick    Reviewed labs and + UTI    A/P  1)FWB: U/S for viability  2)PNC: discussed visits; full PE just done 06/2008               PNV  3)UTI: macrobid  4)nausea: zofran    RTC 2 weeks  Jaelene Garciagarcia C.Taletha Twiford, MD

## 2008-09-17 ENCOUNTER — Ambulatory Visit (HOSPITAL_BASED_OUTPATIENT_CLINIC_OR_DEPARTMENT_OTHER): Payer: Self-pay

## 2008-09-17 DIAGNOSIS — Z8759 Personal history of other complications of pregnancy, childbirth and the puerperium: Secondary | ICD-10-CM | POA: Insufficient documentation

## 2008-09-17 LAB — RPR: RPR: NONREACTIVE

## 2008-09-17 LAB — RUBELLA IGG ANTIBODY: RUBELLA: 128 IU/mL — ABNORMAL HIGH (ref 0–15)

## 2008-09-17 LAB — HIV 1 AND 2 PLUS O ANTIBODY: HIV 1 AND 2 PLUS O SCREEN: NONREACTIVE

## 2008-09-17 LAB — HEPATITIS B SURFACE ANTIGEN: HEPATITIS B SURFACE ANTIGEN: NONREACTIVE

## 2008-09-17 LAB — TREPONEMA PALLIDUM AB IGG: TREPONEMA PALLIDUM AB IgG: REACTIVE — AB

## 2008-09-18 ENCOUNTER — Telehealth (HOSPITAL_BASED_OUTPATIENT_CLINIC_OR_DEPARTMENT_OTHER): Payer: Self-pay

## 2008-09-18 LAB — US OB 1ST TRIMESTER

## 2008-09-18 NOTE — Telephone Encounter (Signed)
Message from Dr. Vilinda Blanks read to pt.  Very happy to hear the news.

## 2008-09-18 NOTE — Telephone Encounter (Signed)
Staff Message copied by Ova Freshwater on Fri Sep 18, 2008 5:21 PM  ------   Message from: Larena Glassman   Created: Fri Sep 18, 2008 4:19 PM   Regarding: P/n pt returning nurse's call from today   Contact: [phone]    [facility]    [place] [id number], 33 year old, female, Telephone Information:  Home Phone [phone]  Work Phone [phone]  Mobile [phone]      Cleotis Lema NUMBER: [phone]  Best time to call back:   Cell phone:   Other phone:    Available times:    Patient's language of care: [place]    Patient does not need an interpreter.    Patient's PCP: Eileen Stanford, MD    Person calling on behalf of patient: Patient (self)    Calls today Returning phonecall  P/n pt returning nurse's call from today about u/s results.    Patient's Preferred Pharmacy:   [facility]  Phone: [phone] Fax: [phone]    [facility] ([facility])  Phone: [phone] Fax: [phone]

## 2008-09-18 NOTE — Progress Notes (Signed)
FSW explained to pt the benefits of [employer] Limited and Healthy Start, and called the [facility]rollment [facility] to report pt's pregnancy/EDD and upgrade her coverage from [facility] to [facility] [place] Limited plus HEAL. However, the office didn't respond the call (20 minutes waiting). Pt will return to FSW on Wednesday, 06/23, after the visit with OB doctor.

## 2008-09-18 NOTE — Telephone Encounter (Signed)
Per epic notes, DR. Domingues asked to be  Paged  with ob/ u/s results. Has per Dr. Vilinda Blanks request, can call patient to inform of normal OB U/S results  Call to patient but no answer. I left a message to call the nurse at [facility] when possible today

## 2008-09-18 NOTE — Progress Notes (Signed)
FSW contacted the [facility] Enrollment [facility] to report pt's new pregnancy/EDD, and to upgrade her coverage from [facility] to [facility] Limited plus Healthy Start.

## 2008-09-21 ENCOUNTER — Encounter (HOSPITAL_BASED_OUTPATIENT_CLINIC_OR_DEPARTMENT_OTHER): Payer: Self-pay

## 2008-09-21 ENCOUNTER — Emergency Department (HOSPITAL_BASED_OUTPATIENT_CLINIC_OR_DEPARTMENT_OTHER)
Admission: RE | Admit: 2008-09-21 | Disposition: A | Discharge status: Home / Self Care | Payer: Self-pay | Source: Emergency Department

## 2008-09-21 NOTE — ED Notes (Signed)
Pt arrive ambulatory with report of left sided back pain with UTI receiving treatment.  Pt denies CVA tenderness, but states back hurts on palpation.  Pt reports currently taking macrodantin for UTI.  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

## 2008-09-22 ENCOUNTER — Telehealth (HOSPITAL_BASED_OUTPATIENT_CLINIC_OR_DEPARTMENT_OTHER): Payer: Self-pay

## 2008-09-22 MED ORDER — ONDANSETRON HCL 4 MG PO TABS
ORAL_TABLET | ORAL | Status: DC
Start: 2008-09-22 — End: 2008-10-01

## 2008-09-22 MED ORDER — CEPHALEXIN 500 MG PO CAPS
ORAL_CAPSULE | ORAL | Status: AC
Start: 2008-09-22 — End: 2008-09-29

## 2008-09-22 NOTE — Telephone Encounter (Signed)
Unable to reach pt. LM on VM to call back.  Ans/line on service after 5:00pm

## 2008-09-22 NOTE — Telephone Encounter (Signed)
Staff Message copied by Ova Freshwater on Tue Sep 22, 2008 7:56 PM  ------   Message from: Simonne Martinet   Created: Tue Sep 22, 2008 4:14 PM   Regarding: FW: P/n pt back pain   Contact: [phone]      ----- Message -----   From: Larena Glassman   Sent: Sep 22, 2008 4:02 PM   To: [facility] Pool  Subject: P/n pt back pain     [facility]    [place] [id number], 33 year old, female, Telephone Information:  Home Phone [phone]  Work Phone Not on file.  Mobile [phone]      Cleotis Lema NUMBER: [phone]  Best time to call back:   Cell phone:   Other phone:    Available times:    Patient's language of care: [place]    Patient does not need an interpreter.    Patient's PCP: Eileen Stanford, MD    Person calling on behalf of patient: Patient (self)    Calls today   P/n pt was seen yesterday at the er for back pain,was given rx for antibiotics for uti symptoms,calling w/ questions about antibiotics.    Patient's Preferred Pharmacy:   [facility]  Phone: [phone] Fax: [phone]    [facility] ([facility])  Phone: [phone] Fax: [phone]

## 2008-09-22 NOTE — Discharge Instructions (Signed)
Diagnosis: back pain in pregnancy    You were seen for back pain in pregnancy. You do need to be admitted to the hospital or have any additional testing tonight. Your urine test was normal and you do not have a fever. We are changing your antibiotic. STOP taking the macrobid, and start keflex. Take the antinausea medication as needed to make sure you get good treatment with the antibiotic.     Call Dr. Shaune Pascal office in the morning to schedule follow up within the week. Return to the ED if you have fever, worsening pain, difficulty urinating or as needed

## 2008-09-23 LAB — EMERGENCY ROOM NOTE

## 2008-09-23 NOTE — Telephone Encounter (Signed)
Pt reports, went to ED 09/21/08, because she was having low back pain. And  She got scare.  Was given new ambx, that she never started.  She continue with Microbid as indicated by Dr Vilinda Blanks, "she game the medication for 14 days". Already had 6 doses.  Feeling better no pain, no fever. Denied any OB problems at the present.  Pt advised to call with any concerns.  Message forwarded to Dr. Vilinda Blanks

## 2008-09-24 ENCOUNTER — Telehealth (HOSPITAL_BASED_OUTPATIENT_CLINIC_OR_DEPARTMENT_OTHER): Payer: Self-pay | Admitting: Registered Nurse

## 2008-09-24 NOTE — Telephone Encounter (Signed)
[place] - Dept of Public Health   (hide header)   From Onnie Graham     Sent Sep 24, 2008 12:59 PM     To [facility]     Phone [phone]     Subject Dept of Public Health     Patient Claudia Lawrence [[id number]] (DOB: 1975-11-10)     Phone Entered Pt Home     [phone]                Message [facility]    [place] [id number], 33 year old, female, Telephone Information:  Home Phone [phone]  Work Phone Not on file.  Mobile [phone]      Cleotis Lema NUMBER: [phone]  Best time to call back: Anytime  Cell phone:   Other phone:    Available times:    Patient's language of care: [place]    Patient does not need an interpreter.    Patient's PCP: Eileen Stanford, MD    Person calling on behalf of patient: Dept of Public Health - [place]    Requested call back from nurse.    Thanks    Patient's Preferred Pharmacy:   [facility]  Phone: [phone] Fax: [phone]    [facility] ([facility])  Phone: [phone] Fax: [phone]

## 2008-09-24 NOTE — Telephone Encounter (Signed)
Returned call to Liborio Nixon [phone] Department of Public health.  Message left on voice mail to call [facility] clinic.  Awit call back.

## 2008-09-25 ENCOUNTER — Telehealth (HOSPITAL_BASED_OUTPATIENT_CLINIC_OR_DEPARTMENT_OTHER): Payer: Self-pay

## 2008-09-25 NOTE — Telephone Encounter (Signed)
Staff Message copied by Alvino Blood on Fri Sep 25, 2008 1:44 PM  ------   Message from: Murvin Donning   Created: Fri Sep 25, 2008 10:40 AM   Regarding: speak to the nurse    [facility]    [place] [id number], 33 year old, female, Telephone Information:  Home Phone [phone]  Work Phone Not on file.  Mobile [phone]      CALL BACK NUMBER:[phone]  Best time to call back:anytime  Cell phone:   Other phone:    Available times:    Patient's language of care: [place]    Patient does not need an interpreter.    Patient's PCP: Eileen Stanford, MD    Person calling on behalf of patient: Liborio Nixon [facility]    Calls today to speak to the nurse regarding patient LABs      Thanks    Patient's Preferred Pharmacy:   [facility]  Phone: [phone] Fax: [phone]    [facility] ([facility])  Phone: [phone] Fax: [phone]

## 2008-09-25 NOTE — Telephone Encounter (Signed)
Staff Message copied by Ova Freshwater on Fri Sep 25, 2008 3:46 PM  ------   Message from: Larena Glassman   Created: Fri Sep 25, 2008 1:51 PM   Regarding: Liborio Nixon from [facility]   Contact: [phone]    [facility]    [place] [id number], 33 year old, female, Telephone Information:  Home Phone [phone]  Work Phone Not on file.  Mobile [phone]      Cleotis Lema NUMBER: [phone] [id number]  Best time to call back:   Cell phone:   Other phone:    Available times:    Patient's language of care: [place]    Patient does not need an interpreter.    Patient's PCP: Eileen Stanford, MD    Person calling on behalf of patient: Liborio Nixon from [facility]    Calls today   Liborio Nixon from [facility] returning nurse's call    Patient's Preferred Pharmacy:   [facility]  Phone: [phone] Fax: [phone]    [facility] ([facility])  Phone: [phone] Fax: [phone]

## 2008-09-25 NOTE — Telephone Encounter (Signed)
Call Claudia Lawrence back at [facility] no answer I left massage on voice mail to call back when possible

## 2008-09-25 NOTE — Telephone Encounter (Signed)
Dear Claudia Lawrence /Dr. 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    I did  Aggie Moats to call me back on ext [phone]    Thank you ! 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   Or If PCP thinks it is a biologic false Possitive. [facility] needs to know, other wise they will call every time lab work is done in this pt.   Pt is pregnant Again and per protocol RPR and TPAB were done. Please call Liborio Nixon form [facility]   At [phone] ext [phone]   Result Information

## 2008-09-25 NOTE — Telephone Encounter (Signed)
Liborio Nixon from [facility] wants to know, if pt was treated for Reactive  TPAB.    Or If PCP thinks it is a biologic false Possitive.  [facility] needs to know, other wise they will call every time lab work is done in this pt.  Pt is pregnant  Again and per protocol  RPR and TPAB were done. Please call Mignon [facility]  At [phone] ext [phone]    Result Information                         Status  Provider Status         Abnormal  Edited (09/17/2008 11:04 AM)  Ordered                     Component Results                           Component  Value  Flag  Low  High  Units  Status      TREPONEMA PALLIDUM ANTIBODY  REACTIVE  A  NONREACTIVE    Fin      Comment:   RESULT CONFIRMED BY REPEAT ANALYSIS

## 2008-09-29 ENCOUNTER — Telehealth (HOSPITAL_BASED_OUTPATIENT_CLINIC_OR_DEPARTMENT_OTHER): Payer: Self-pay

## 2008-09-29 ENCOUNTER — Ambulatory Visit (HOSPITAL_BASED_OUTPATIENT_CLINIC_OR_DEPARTMENT_OTHER): Payer: PRIVATE HEALTH INSURANCE

## 2008-09-29 NOTE — Telephone Encounter (Signed)
Dear Dr. 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   Or If PCP thinks it is a biologic false Possitive. [facility] needs to know, other wise they will call every time lab work is done in this pt.   Pt is pregnant Again and per protocol RPR and TPAB were done. Please call Liborio Nixon form [facility]   At [phone] ext [phone]     Liborio Nixon want to talk to Dr. Albertina Parr regarding persistnt positive  trepolema pallidum antibdy.    Thank you !

## 2008-09-29 NOTE — Telephone Encounter (Signed)
Dear Dr. Albertina Parr , RNs and Dr. Maggie Schwalbe    I did phone the patient this morning offering apology for misformed PPD postive,  Patient said she never receive PPD test during last pregnancy or recently .    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 Albertina Parr. Patient has no history of syphillis infection, never received treatment.    Patient is appreciated  The clarification.    Thank you !       Claudia Lawrence

## 2008-10-01 ENCOUNTER — Ambulatory Visit (HOSPITAL_BASED_OUTPATIENT_CLINIC_OR_DEPARTMENT_OTHER): Payer: PRIVATE HEALTH INSURANCE

## 2008-10-01 VITALS — BP 100/60 | Wt 159.0 lb

## 2008-10-01 DIAGNOSIS — Z8759 Personal history of other complications of pregnancy, childbirth and the puerperium: Secondary | ICD-10-CM | POA: Insufficient documentation

## 2008-10-01 DIAGNOSIS — Z7189 Other specified counseling: Principal | ICD-10-CM

## 2008-10-01 DIAGNOSIS — O0990 Supervision of high risk pregnancy, unspecified, unspecified trimester: Principal | ICD-10-CM

## 2008-10-01 MED ORDER — PENICILLIN G BENZATHINE 600000 UNIT/ML IM SUSP
INTRAMUSCULAR | Status: DC
Start: 2008-10-01 — End: 2008-10-01

## 2008-10-01 MED ORDER — PENICILLIN G BENZATHINE & PROC 900000-300000 UNIT/2ML IM SUSP
INTRAMUSCULAR | Status: DC
Start: 2008-10-01 — End: 2008-10-30

## 2008-10-01 MED ORDER — ONDANSETRON HCL 8 MG PO TABS
ORAL_TABLET | ORAL | Status: DC
Start: 2008-10-01 — End: 2009-02-10

## 2008-10-01 MED ORDER — ONDANSETRON HCL 8 MG PO TABS
ORAL_TABLET | ORAL | Status: DC
Start: 2008-10-01 — End: 2008-10-01

## 2008-10-01 NOTE — Progress Notes (Signed)
.  .  .      The patient denies allergies to penicillins. Review side effects of medication with patient in [place].   The patient was instructed to remain in clinic for 20 minutes and report any side effects to the nurse right away.  Penicillin G benzathine 2,400,000 units given deep IM as per Dr. Vilinda Blanks order. 1,200,000 given on left upper buttocks and 1,200,000 units given on Right upper buttocks.Lot #[id number] exp 3/12

## 2008-10-06 ENCOUNTER — Telehealth (HOSPITAL_BASED_OUTPATIENT_CLINIC_OR_DEPARTMENT_OTHER): Payer: Self-pay

## 2008-10-06 LAB — RPR: RPR: NONREACTIVE

## 2008-10-06 LAB — TREPONEMA PALLIDUM AB IGG: TREPONEMA PALLIDUM AB IgG: REACTIVE — AB

## 2008-10-06 NOTE — Telephone Encounter (Signed)
Era appt booked at [facility] on 10/15/08 GC at 11:00 am and Ultrasound at 11:30 am.

## 2008-10-14 ENCOUNTER — Telehealth (HOSPITAL_BASED_OUTPATIENT_CLINIC_OR_DEPARTMENT_OTHER): Payer: Self-pay

## 2008-10-15 ENCOUNTER — Ambulatory Visit (HOSPITAL_BASED_OUTPATIENT_CLINIC_OR_DEPARTMENT_OTHER): Payer: PRIVATE HEALTH INSURANCE

## 2008-10-15 VITALS — BP 100/80 | Temp 98.3°F

## 2008-10-15 DIAGNOSIS — A530 Latent syphilis, unspecified as early or late: Principal | ICD-10-CM

## 2008-10-15 NOTE — Progress Notes (Signed)
.  .  .      .     The patient denies allergies to penicillins.   The patient was instructed to remain in clinic for 20 minutes and report any side effects to the nurse right away.   Penicillin G benzathine 2,400,000 units given deep IM as per Dr. Vilinda Blanks order. 1,200,000 given on left upper buttocks and 1,200,000 units given on Right upper buttocks.Lot #[id number] exp 3/12.  Reminded to schedule apt for final dose in 1 wk.  The patient indicates understanding of these issues and agrees with the plan.

## 2008-10-19 ENCOUNTER — Ambulatory Visit (HOSPITAL_BASED_OUTPATIENT_CLINIC_OR_DEPARTMENT_OTHER): Payer: PRIVATE HEALTH INSURANCE

## 2008-10-19 VITALS — BP 102/56 | HR 74 | Temp 97.5°F | Wt 158.0 lb

## 2008-10-19 DIAGNOSIS — G43009 Migraine without aura, not intractable, without status migrainosus: Principal | ICD-10-CM

## 2008-10-19 NOTE — Progress Notes (Signed)
.  .  .  Neurology Consultation    Dr. Nedra Hai requested my opinion regarding Claudia Lawrence's headaches. She is seen with her husband, Claudia Lawrence, who serves as Nurse, learning disability.    Claudia Lawrence is a 33 year old right-handed woman with headaches for about 10 years. She is currently about [redacted] weeks pregnant. Her headaches occur without aura. She develops a bifrontal pain that builds up over about 2 hours. The pain can be throbbing and at its worst is quite severe. She often gets nausea, photo- and phonophobia; occasionally vomiting. The pain is severe enough that she has to lie down; lying down helps. The headaches can last up to a day. No associated nasal congestion or tearing.    Prior to pregnancy she was getting headaches weekly; currently they are somewhat less frequently. With her prior pregnancies, headaches were severe after delivery. She treats the headaches with Excedrin migraine with partial relief. Tylenol does not help. She's been prescribed amitriptyline 10 mg qhs, Fioricet, but never tried them because she found out she's pregnant.    Headaches occasionally wake her from sleep but this is not new. She's having more nausea with headaches over recent years, but they haven't changed otherwise. No clear precipitants for her headaches; not clearly perimenstrual.    ROS: Her mood is good. She's feeling well. Denies chest pain, shortness of breath, abdominal pain, rash, fever.    PMH: 1) lipoma; 2) frequent UTIs    FH: Mother and grandmother with migraine. Grandmother had a stroke around age 67.    SH: Married with two daughters, Claudia Lawrence and Claudia Lawrence. 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 No smoking or alcohol.    EXAM: Patient is alert, well-groomed, appears their stated age and is in no acute distress. Full ROM at the neck. Fundoscopy benign bilaterally.    Mental status: She was attentive to the exam. Speech fluent, without dysarthria or paraphasias. She gave a detailed and coherent history.    Cranial nerves:  Pupils 5mm, round and reactive to light, directly and consensually. Extraocular movements full without nystagmus. Visual fields full to confrontation. Facial sensation normal. Facial strength full and symmetric. Hears finger rub bilaterally. Palate elevates symmetrically. Tongue protrudes midline. Shoulder shrug symmetric.    Motor: Normal bulk and tone in all extremities. No fasciculations. No pronator drift. Power in the limbs, as follows:      Delt  Bi  Tri  WE  FE  IO  APB   IP   Ham   Quad   TA  toe ext   toe flex  R 5     5    5     5     5     5    5       5      5         5         5      5           5   L  5     5    5     5     5     5    5       5      5         5         5       5  5    Sensory: Vibratory threshold >20 seconds in the great toes. Light touch perception intact in all extremities in detail. Cold perception intact in all extremities in detail.    Coordination: No tremor or other involuntary movements observed. Fine finger movements normal bilaterally. Finger-nose testing without dysmetria bilaterally.    Reflexes: 2+ and symmetric bilaterally. Toes down-going on the right and down-going on the left.    Stance and Gait: No Romberg. Walks with a normal base, normal posture, normal stride length and normal arm swing. Gait is steady. Able to tandem forwards without difficulty. Turns normally. Able to arise from a seated position without upper extremity assistance. Able to walk up on heels and toes well.    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 Her exam is normal and there is a family history of migraine; I feel comfortable with the diagnosis and don't feel she needs brain imaging. She is currently [redacted] weeks pregnant, limiting treatment options; fortunately, her migraines have improved with pregnancy. She will continue to use Excedrin migraine (aspirin, acetaminophen and caffeine) sparingly as needed; she can also use Fioricet. I advised  her not to use the Excedrin migraine the month prior to delivery.     We reviewed the lifestyle modifications that may help her migraine (regular sleep, regular meals and aerobic exercise). We reviewed that post-partum, if headaches become frequent again, we can try a daily preventive agent. We also reviewed that a trial of a triptan once she's through the pregnancy would be reasonable.    She will give me a call if things are not manageable during the pregnancy and will return to see me post-partum if migraines are sufficiently bothersome.

## 2008-10-22 ENCOUNTER — Ambulatory Visit (HOSPITAL_BASED_OUTPATIENT_CLINIC_OR_DEPARTMENT_OTHER): Payer: PRIVATE HEALTH INSURANCE

## 2008-10-22 VITALS — BP 110/70 | Temp 97.7°F | Wt 162.0 lb

## 2008-10-22 DIAGNOSIS — A539 Syphilis, unspecified: Principal | ICD-10-CM

## 2008-10-22 MED ORDER — PENICILLIN G BENZATHINE 600000 UNIT/ML IM SUSP
INTRAMUSCULAR | Status: AC
Start: 2008-10-22 — End: 2008-10-22

## 2008-10-22 NOTE — Progress Notes (Addendum)
Addended by: Dineen Kid on: 10/22/2008 4:38:19 PM     Modules accepted: Orders, SmartSet

## 2008-10-22 NOTE — Progress Notes (Signed)
.  .  .        The patient denies allergies to penicillins.     Penicillin G benzathine 2,400,000 units given deep IM as per Dr. Vilinda Blanks order. 1,200,000 given on left upper buttocks and 1,200,000 units given on Right upper buttocks.Lot #[id number] exp 3/12.

## 2008-10-28 ENCOUNTER — Encounter (HOSPITAL_BASED_OUTPATIENT_CLINIC_OR_DEPARTMENT_OTHER): Payer: Self-pay

## 2008-10-30 ENCOUNTER — Ambulatory Visit (HOSPITAL_BASED_OUTPATIENT_CLINIC_OR_DEPARTMENT_OTHER): Payer: PRIVATE HEALTH INSURANCE

## 2008-10-30 VITALS — BP 118/68 | Wt 164.0 lb

## 2008-10-30 DIAGNOSIS — O0990 Supervision of high risk pregnancy, unspecified, unspecified trimester: Principal | ICD-10-CM

## 2008-11-01 LAB — URINE CULTURE/COLONY COUNT

## 2008-11-03 ENCOUNTER — Other Ambulatory Visit (HOSPITAL_BASED_OUTPATIENT_CLINIC_OR_DEPARTMENT_OTHER): Payer: Self-pay

## 2008-11-03 DIAGNOSIS — N3090 Cystitis, unspecified without hematuria: Principal | ICD-10-CM

## 2008-11-03 MED ORDER — NITROFURANTOIN MACROCRYSTAL 100 MG PO CAPS
ORAL_CAPSULE | ORAL | Status: AC
Start: 2008-11-03 — End: 2008-11-20

## 2008-11-04 ENCOUNTER — Telehealth (HOSPITAL_BASED_OUTPATIENT_CLINIC_OR_DEPARTMENT_OTHER): Payer: Self-pay | Admitting: Registered Nurse

## 2008-11-04 NOTE — Telephone Encounter (Signed)
Message copied by Olevia Bowens on Wed Nov 04, 2008  8:40 AM  ------       Message from: Murvin Donning       Created: Wed Nov 04, 2008  8:33 AM       Regarding: Med Refill         [facility]              Claudia Lawrence [id number], 33 year old, female, Telephone Information:       Home Phone      [phone]       Work Phone      Not on file.       Mobile          [phone]                     Claudia Lawrence NUMBER:    [phone]       Best time to call back: anytime       Cell phone:        Other phone:              Available times:              Patient's language of care: [place]              Patient does not need an interpreter.              Patient's PCP: Eileen Stanford, MD              Person calling on behalf of patient: Patient (self)              Calls today Returning phonecall                            Thanks                     Patient's Preferred Pharmacy:        [facility] [address] [place]              Phone: [phone] Fax: [phone]              [facility] ([facility]              Phone: [phone] Fax: [phone]

## 2008-11-04 NOTE — Telephone Encounter (Signed)
Please call pt first thing Wed AM  She has UTI (now her 3rd) and pregnant  She has had losses in past due to UTI's  Rx sent; Antbx for 14 days total and take all  Please let me know that you got in touch with her  Thanks  AD

## 2008-11-04 NOTE — Telephone Encounter (Signed)
Call to patient. Reviewed uti. Patient reports is pushing fluids, not holding urine in bladder. No symptoms per patient. Will pick up script at local pharmacy and f/u 11/25/08 with CNM and 12/24/08 with PCP as scheduled.

## 2008-11-04 NOTE — Telephone Encounter (Signed)
Call to patient. Left message with husband to have patient call [facility] RN urgently re: labs. He will relay the message to her.

## 2008-11-13 NOTE — Progress Notes (Signed)
FSW translated to pt the [facility] letter informing that her HSN coverage has been upgraded to [facility] plus [employer], checked the cards received by pt, and gave information about the [facility] program - documentation required to enroll and directions to the office in [place].

## 2008-11-18 ENCOUNTER — Ambulatory Visit (HOSPITAL_BASED_OUTPATIENT_CLINIC_OR_DEPARTMENT_OTHER): Payer: Self-pay

## 2008-11-19 LAB — US OB 2ND OR 3RD TRIMESTER SURVEY

## 2008-11-25 ENCOUNTER — Ambulatory Visit (HOSPITAL_BASED_OUTPATIENT_CLINIC_OR_DEPARTMENT_OTHER): Payer: PRIVATE HEALTH INSURANCE

## 2008-11-25 ENCOUNTER — Encounter (HOSPITAL_BASED_OUTPATIENT_CLINIC_OR_DEPARTMENT_OTHER): Payer: Self-pay

## 2008-11-25 VITALS — BP 100/70 | Wt 166.0 lb

## 2008-11-25 DIAGNOSIS — O2340 Unspecified infection of urinary tract in pregnancy, unspecified trimester: Secondary | ICD-10-CM

## 2008-11-25 DIAGNOSIS — O0990 Supervision of high risk pregnancy, unspecified, unspecified trimester: Principal | ICD-10-CM

## 2008-11-28 LAB — URINE CULTURE/COLONY COUNT

## 2008-12-02 ENCOUNTER — Ambulatory Visit (HOSPITAL_BASED_OUTPATIENT_CLINIC_OR_DEPARTMENT_OTHER): Payer: Self-pay

## 2008-12-02 ENCOUNTER — Other Ambulatory Visit (HOSPITAL_BASED_OUTPATIENT_CLINIC_OR_DEPARTMENT_OTHER): Payer: Self-pay

## 2008-12-02 DIAGNOSIS — N3090 Cystitis, unspecified without hematuria: Principal | ICD-10-CM

## 2008-12-02 DIAGNOSIS — O0990 Supervision of high risk pregnancy, unspecified, unspecified trimester: Secondary | ICD-10-CM

## 2008-12-02 DIAGNOSIS — O2340 Unspecified infection of urinary tract in pregnancy, unspecified trimester: Principal | ICD-10-CM

## 2008-12-02 LAB — US OB FOLLOW UP

## 2008-12-02 MED ORDER — NITROFURANTOIN MONOHYD MACRO 100 MG PO CAPS
ORAL_CAPSULE | ORAL | Status: AC
Start: 2008-12-02 — End: 2008-12-18

## 2008-12-02 MED ORDER — SULFAMETHOXAZOLE-TRIMETHOPRIM 400-80 MG PO TABS
ORAL_TABLET | ORAL | Status: DC
Start: 2008-12-02 — End: 2008-12-24

## 2008-12-03 ENCOUNTER — Telehealth (HOSPITAL_BASED_OUTPATIENT_CLINIC_OR_DEPARTMENT_OTHER): Payer: Self-pay | Admitting: Registered Nurse

## 2008-12-03 NOTE — Telephone Encounter (Signed)
Please call this PN pt of mine  She has a UTI and MaryEllen sent a Rx for Bactrim today (9/8). However, I want to change it to macrobid and I sent that Rx  Roselee Nova is aware of the change. Please apologize to the pt for the confusion but I prefer macrobid and she is my PN pt  She is high risk and this is her 3rd UTI this pregnancy; she is very compliant but please review importance of taking all 14 days  Thanks  [address]

## 2008-12-03 NOTE — Telephone Encounter (Signed)
Call to patient. Has not picked up Bactrim script. RN advised change in med. To take Macrobid BID for 2 wks instead. Has appt end of month, will recheck urine then. Agrees with plan. To call if any issues/concerns in interim.

## 2008-12-14 ENCOUNTER — Ambulatory Visit (HOSPITAL_BASED_OUTPATIENT_CLINIC_OR_DEPARTMENT_OTHER): Payer: PRIVATE HEALTH INSURANCE | Admitting: Registered"

## 2008-12-14 VITALS — BP 106/68 | HR 68 | Temp 97.1°F | Resp 18 | Ht 61.0 in | Wt 170.0 lb

## 2008-12-14 DIAGNOSIS — Z3480 Encounter for supervision of other normal pregnancy, unspecified trimester: Principal | ICD-10-CM

## 2008-12-14 NOTE — Progress Notes (Signed)
Marland Kitchen  PRENATAL NUTRITION ASSESSMENT AND CARE PLAN    Claudia Lawrence is at visit for nutrition prenatal care. Patient is 33 year old.    The patient is from [place].  Patient's language is [place]; She has been in the [place] for > 5 years.    Visit without interpreter. HUSBAND IS INTERPRETING     S: PARTICIPANT'S COMMENTS    Patient reported: No complaints.    Food allergies: No known allergies  Food intolerances: Milk  Work/School: Yes -  [employer]   Living Situation: with husband  Infant feeding Choice: breast  Previous infant feeding: breast    O:  Obstetric History   G7  P2  T2  P0  A4  E0  M0  L2      EDD: Estimated Date of Delivery: 04/20/09  Weeks of gestation: There is no pregnancy episode linked to this encounter.  Ht: Most Recent Height Reading(s)  09/14/2008 : 5\' 1"  (1.549 m)    Pregravid Wt:   Pre-pregnancy weight is considered: Very overweight  Wt: Most Recent Weight Reading(s)  11/25/2008 : 166 lb (75.297 kg)      Weight change: Gained 16 pounds in the past 6 month(s).      Component Value Date   HCT 34.4* 09/14/2008       Component Value Date   HGB 11.8* 09/14/2008       Component Value Date   GLUCOSER 92 03/23/2008       No results found for this basename: glucosef:1    No results found for this basename: glucose1hr50:1    No results found for this basename: glucose2hr:1    No results found for this basename: glucose3hr:1  Most Recent BP Reading(s)  11/25/2008 : 100/70    Patient has history of: None.  Vitamin/Mineral Supplements: Pre-Natal Vitamin.    Medications:     Current outpatient prescriptions:  SULFAMETHOXAZOLE-TRIMETHOPRIM 400-80 MG OR TABS 1 TABLET EVERY 12 HOURS Disp: 28 tablet Rfl: 0   NITROFURANTOIN MONOHYD MACRO 100 MG OR CAPS 1 CAPSULE EVERY 12 HOURS WITH FOOD X 14 DAYS Disp: 28 capsule Rfl: 0   ONDANSETRON HCL 8 MG OR TABS 1 TABLET 3 TIMES DAILY Disp: 30 Rfl: 4   PRENATAL 1 PLUS 1 OR TABS 1 TABLET DAILY ( prenatal vitamine ) Disp: 30 Rfl: 9       Drinking   Current: No alcoholic  drinks.  3 months prior: No alcoholic drinks.  Smoking  Current: 0 cigarette(s) per day.  3 months prior: 0 cigarette(s) per day.  Physical Activity:  Type: physically demanding work  Barriers to physical activity include: lack of motivation  Date of last delivery/term:   Date of first prenatal visit:     Food availability (questions to be completed by [facility]):  1.                         2.                        3.                         4.    A: ASSESSMENT OF DIET:  Calories: Adequate  Protein: Inadequate  Calcium: Inadequate  Iron: Adequate  Vitamin A:  Adequate  Vitamin C: Adequate  Water: Adequate  Fiber: Adequate  Sugar:Inadequate  Activity: Inadequate    ASSESSMENT OF  NUTRITIONAL RISK   Inadequate diet  Low hematocrit  Low hemoglobin  Patient's weight gain is: above recommended range    P: COUNSELING/EDUCATION  Receptive to counseling: yes  Discussed with patient:   Prenatal diet, Information on listeria, Information on mercury and fish, Vitamin/Mineral supplements, Weight gain high  IRON RICH FOODS     Focus Points: WALK DAILY 2 SMLL PORTIONS RICE  3 SMALL FREQUENT MEALS   Materials provided: Plate method    Source of Nutrition Services:  Health Center: [facility]  [facility] Site/number: Not currently enrolled, but interested in information about WIC.    Referrals  None      NUTR FOLLOW-UP:  [facility] Follow up in 2 month(s)    _____________________________________________       Date: 12/14/2008   Provider signature  Dannielle Karvonen    Facility/Agency: [facility]          Phone: [phone]

## 2008-12-21 ENCOUNTER — Telehealth (HOSPITAL_BASED_OUTPATIENT_CLINIC_OR_DEPARTMENT_OTHER): Payer: Self-pay

## 2008-12-21 NOTE — Telephone Encounter (Signed)
Left message on voice mail, return call to [facility].

## 2008-12-21 NOTE — Telephone Encounter (Signed)
Message copied by Dineen Kid on Mon Dec 21, 2008 10:53 AM  ------       Message from: Murvin Donning       Created: Mon Dec 21, 2008  9:57 AM       Regarding: sick call - 6 months preg. with cold         [facility]              [place] [id number], 33 year old, female, Telephone Information:       Home Phone      [phone]       Work Phone      Not on file.       Mobile          [phone]                     Cleotis Lema NUMBER: [phone]              Best time to call back: anytimeCell phone:        Other phone:              Available times:              Patient's language of care: [place]              Patient does not need an interpreter.              Patient's PCP: Eileen Stanford, MD              Person calling on behalf of patient: husband- Domingo Cocking              Calls today with a sick call.the patient husband called and said his wife is 6 month preg. With high risk. Called today and said she's been having cold for 3 days.  Please called the pt and advise.              Thanks                     Patient's Preferred Pharmacy:        [facility] [address] [place]              Phone: [phone] Fax: [phone]              [facility] ([facility]              Phone: [phone] Fax: [phone]

## 2008-12-21 NOTE — Telephone Encounter (Signed)
Pt's husband (translating) reporting URI symptoms with congestion x 3 days, he had it last wk..  Denies fevers or SOB.  Probably a virus, advised to push fluids, rest, OTC pain medications, Tylenolas directed, throat lozengers & warm salt gargles. The patient"s husband indicates understanding of these issues and agrees with the plan. Will [facility] if sx's worsen or do not improve in the next 5 days.Marland Kitchen

## 2008-12-24 ENCOUNTER — Encounter (HOSPITAL_BASED_OUTPATIENT_CLINIC_OR_DEPARTMENT_OTHER): Payer: Self-pay

## 2008-12-24 ENCOUNTER — Ambulatory Visit (HOSPITAL_BASED_OUTPATIENT_CLINIC_OR_DEPARTMENT_OTHER): Payer: PRIVATE HEALTH INSURANCE

## 2008-12-24 VITALS — BP 104/58 | Wt 172.0 lb

## 2008-12-24 DIAGNOSIS — O0990 Supervision of high risk pregnancy, unspecified, unspecified trimester: Secondary | ICD-10-CM

## 2008-12-24 DIAGNOSIS — N3090 Cystitis, unspecified without hematuria: Principal | ICD-10-CM

## 2008-12-24 MED ORDER — NITROFURANTOIN MONOHYD MACRO 100 MG PO CAPS
ORAL_CAPSULE | ORAL | Status: DC
Start: 2008-12-24 — End: 2009-01-14

## 2008-12-25 LAB — URINE CULTURE/COLONY COUNT

## 2008-12-28 ENCOUNTER — Telehealth (HOSPITAL_BASED_OUTPATIENT_CLINIC_OR_DEPARTMENT_OTHER): Payer: Self-pay

## 2008-12-28 NOTE — Telephone Encounter (Signed)
RN's  Please call pt and inform her that urine cx came back negative  She has had 3 UTI's already this pregnancy  So now she can start nightly suppression as we discussed  Thanks  AD

## 2008-12-29 ENCOUNTER — Telehealth (HOSPITAL_BASED_OUTPATIENT_CLINIC_OR_DEPARTMENT_OTHER): Payer: Self-pay

## 2008-12-29 NOTE — Telephone Encounter (Signed)
Call to patient. Left voicemail message to return RN call when able re: lab results.

## 2008-12-29 NOTE — Telephone Encounter (Signed)
Pt informed about urine result and instructed to start taking Abx every night as discussed with Dr. Vilinda Blanks.  Pt reports, she already started the abx last week.  Advised to increase liqs.  6-8 glasses/day

## 2008-12-29 NOTE — Telephone Encounter (Signed)
Message copied by Ova Freshwater on Tue Dec 29, 2008 10:08 AM  ------       Message from: AMADO-LOUIS, LISA       Created: Tue Dec 29, 2008  9:40 AM       Regarding: returning call         [facility]              [place] [id number], 33 year old, female, Telephone Information:       Home Phone      [phone]       Work Phone      Not on file.       Mobile          [phone]                     Cleotis Lema NUMBER: [phone]       Best time to call back:        Cell phone:        Other phone:              Available times:              Patient's language of care: [place]              Patient needs a [place] interpreter.              Patient's PCP: Eileen Stanford, MD              Person calling on behalf of patient: Patient (self)              Calls today Returning phonecall              Patient's Preferred Pharmacy:        [facility] [facility]              Phone: [phone] Fax: [phone]              [facility] ([facility]              Phone: [phone] Fax: [phone]

## 2008-12-29 NOTE — Telephone Encounter (Signed)
Message copied by Ova Freshwater on Tue Dec 29, 2008 10:09 AM  ------       Message from: AMADO-LOUIS, LISA       Created: Tue Dec 29, 2008  9:40 AM       Regarding: returning call         [facility]              [place] [id number], 33 year old, female, Telephone Information:       Home Phone      [phone]       Work Phone      Not on file.       Mobile          [phone]                     Cleotis Lema NUMBER: [phone]       Best time to call back:        Cell phone:        Other phone:              Available times:              Patient's language of care: [place]              Patient needs a [place] interpreter.              Patient's PCP: Eileen Stanford, MD              Person calling on behalf of patient: Patient (self)              Calls today Returning phonecall              Patient's Preferred Pharmacy:        [facility]              Phone: [phone] Fax: [phone]              [facility] ([facility]              Phone: [phone] Fax: [phone]

## 2009-01-01 ENCOUNTER — Ambulatory Visit (HOSPITAL_BASED_OUTPATIENT_CLINIC_OR_DEPARTMENT_OTHER): Payer: PRIVATE HEALTH INSURANCE

## 2009-01-01 VITALS — Temp 98.0°F

## 2009-01-01 DIAGNOSIS — Z23 Encounter for immunization: Principal | ICD-10-CM

## 2009-01-01 NOTE — Progress Notes (Signed)
Influenza Vaccine Procedure  January 01, 2009    1. Has the patient received the information for the influenza vaccine? Yes    2. Does the patient have any of the following contraindications?  Allergy to eggs? No  Allergic reaction to previous influenza vaccines? No  Any other problems to previous influenza vaccines? No  Paralyzed by Guillain-Barre syndrome?  No  Current moderate or severe illness? No  Allergy to contact lens solution? No    3. The vaccine has been administered in the usual fashion.     Immunization information reviewed. Current VIS reviewed and given to patient/ guardian. Verbal assent obtained from patient/ guardian.  See immunization/Injection module or chart review for date of publication and additional information. Verbal assent obtained from patient/guardian. Comfort measures for possible side effects reviewed.

## 2009-01-13 NOTE — Telephone Encounter (Signed)
See note from 12/28/08

## 2009-01-14 ENCOUNTER — Telehealth (HOSPITAL_BASED_OUTPATIENT_CLINIC_OR_DEPARTMENT_OTHER): Payer: Self-pay | Admitting: Ambulatory Care

## 2009-01-14 ENCOUNTER — Ambulatory Visit (HOSPITAL_BASED_OUTPATIENT_CLINIC_OR_DEPARTMENT_OTHER): Payer: PRIVATE HEALTH INSURANCE

## 2009-01-14 VITALS — BP 108/70 | HR 90 | Temp 98.2°F | Wt 176.0 lb

## 2009-01-14 DIAGNOSIS — D509 Iron deficiency anemia, unspecified: Secondary | ICD-10-CM

## 2009-01-14 DIAGNOSIS — A498 Other bacterial infections of unspecified site: Principal | ICD-10-CM

## 2009-01-14 DIAGNOSIS — W57XXXA Bitten or stung by nonvenomous insect and other nonvenomous arthropods, initial encounter: Secondary | ICD-10-CM

## 2009-01-14 DIAGNOSIS — L0390 Cellulitis, unspecified: Secondary | ICD-10-CM

## 2009-01-14 DIAGNOSIS — A6920 Lyme disease, unspecified: Secondary | ICD-10-CM

## 2009-01-14 DIAGNOSIS — R700 Elevated erythrocyte sedimentation rate: Secondary | ICD-10-CM

## 2009-01-14 DIAGNOSIS — R21 Rash and other nonspecific skin eruption: Secondary | ICD-10-CM

## 2009-01-14 LAB — URINALYSIS
BACTERIA: <10 PER HPF — AB (ref 0–?)
BILIRUBIN, URINE: NEGATIVE
CASTS: NONE SEEN PER LPF
CRYSTALS: NONE SEEN
GLUCOSE, URINE: NEGATIVE MG/DL
KETONE, URINE: NEGATIVE MG/DL
NITRITE, URINE: NEGATIVE
OCCULT BLOOD, URINE: NEGATIVE
PH URINE: 6 (ref 5.0–8.0)
PROTEIN, URINE: NEGATIVE MG/DL
SPECIFIC GRAVITY URINE: 1.025 (ref 1.003–1.035)

## 2009-01-14 LAB — CHG SEDIMENTATION RATE RBC NON-AUTOMATED: RBC SEDIMENTATION RATE: 37 MM/HR — ABNORMAL HIGH (ref 0–15)

## 2009-01-14 LAB — CBC, PLATELET & DIFFERENTIAL
BASOPHIL %: 0.2 % (ref 0.0–2.0)
EOSINOPHIL %: 0.9 % (ref 0.0–7.0)
HEMATOCRIT: 32.1 % — ABNORMAL LOW (ref 36.0–48.0)
HEMOGLOBIN: 11 g/dl — ABNORMAL LOW (ref 12.0–16.0)
LYMPHOCYTE %: 17.7 % (ref 13.0–39.0)
MEAN CORP HGB CONC: 34.3 g/dl (ref 32.0–36.0)
MEAN CORPUSCULAR HGB: 28.7 pg (ref 27.0–33.0)
MEAN CORPUSCULAR VOL: 83.6 fl (ref 80.0–100.0)
MEAN PLATELET VOLUME: 8.4 fl (ref 6.4–10.8)
MONOCYTE %: 6.7 % (ref 1.0–12.0)
NEUTROPHIL %: 74.5 % (ref 46.0–79.0)
PLATELET COUNT: 247 10*3/uL (ref 150–400)
RBC DISTRIBUTION WIDTH: 13.6 % (ref 11.5–14.3)
RED BLOOD CELL COUNT: 3.84 M/uL — ABNORMAL LOW (ref 4.50–5.10)
WHITE BLOOD CELL COUNT: 9.8 10*3/uL (ref 4.0–10.8)

## 2009-01-14 MED ORDER — POVIDONE-IODINE 10 % EX OINT
TOPICAL_OINTMENT | CUTANEOUS | Status: AC
Start: 2009-01-14 — End: 2009-02-14

## 2009-01-14 MED ORDER — AMOXICILLIN 500 MG PO CAPS
ORAL_CAPSULE | ORAL | Status: DC
Start: 2009-01-14 — End: 2009-01-17

## 2009-01-14 NOTE — Telephone Encounter (Signed)
Called and left message on VM for Claudia Lawrence no answer.  Called pt phone and spoke with via interpreter and she said that she was at a school cleaning yesterday and she got bit on her stomach by something and she thinks it was a Tick.  Pt denies any diff breathing, CP/SOB, itching or increase in redness at site on her abdomen.  Pt said that the bites is the size of a dime but she also have a long streak the length of a finger but it has not changed, but she is concerned that she is pregnant and not sure if she should be checked out.  Explained to pt that if she is concerned she should be seen.  appt given for today at 415pm

## 2009-01-14 NOTE — Telephone Encounter (Signed)
Message copied by Milus Mallick on Thu Jan 14, 2009 11:00 AM  ------       Message from: Murvin Donning       Created: Thu Jan 14, 2009 10:10 AM       Regarding: sick call - tick bite         [facility]              [place] [id number], 33 year old, female, Telephone Information:       Home Phone      [phone]       Work Phone      Not on file.       Mobile          [phone]                     CALL BACK NUMBER:[phone]       Best time to call back: anytime       ell phone:        Other phone:              Available times:              Patient's language of care: [place]              Patient needs a [place] interpreter.              Patient's PCP: Eileen Stanford, MD              Person calling on behalf of patient: husband - Domingo Cocking              Calls today with a sick call.       Pt' husband called and said his wife is 6 months pregnant and yesterday she was bitten by tick. Please advise.              Thanks                     Patient's Preferred Pharmacy:        [facility]              Phone: [phone] Fax: [phone]              [facility] ([facility]              Phone: [phone] Fax: [phone]

## 2009-01-14 NOTE — Progress Notes (Addendum)
C/c: 3-5 days of abdominal skin rash at deer tick bite  , 6 months pregnant , history of chronic UTI     HPI:Claudia Lawrence is a 33 year old female c/c acute onset of abdominal rash aacfter deer tick bit at abdominal area, huband remove the head of ticks, lyme disease screening  Ordered,+ deer tick bite at abodmen after visit NH, patient 's husband did remove the tick bite ,large  6cm erythyematos patch in the beginning and then shrink to 3 cm in circumference , no target rash like , more sharply demarcated erythyema , swelling and tenderness after obtained wound culture at entry portal site    6 months pregnant , Her due day is 04/10/08    normal EKG, wound culture  enterobacgter colace,resistant to amoxillin ,  senstive to ceftin ,   Patient denied  Fever, no chill, no joint pain.  I did phoned the patient to change antibtioic to Ceftin 500 mg bid for 20 days , it can treat early Lyme and enterobacter colace skin infection, ID consultation ordered            Review of Systems   Constitutional: Negative for fever and chills.   Eyes: Negative.    Respiratory: Negative.    Cardiovascular: Negative.    Genitourinary: Negative.    Musculoskeletal: Negative for myalgias and joint pain.   Skin: Positive for rash and itching.        c/c acute onset of abdominal rash aacfter deer tick bit at abdominal area, huband remove the head of ticks, lyme disease screening  Ordered,+ deer tick bite at abodmen after visit NH, patient 's husband did remove the tick bite ,large  6cm erythyematos patch in the beginning and then shrink to 3 cm in circumference , no target rash like , more sharply demarcated erythyema , swelling and tenderness after obtained wound culture at entry portal site           Neurological: Negative.  Negative for headaches.   Endo/Heme/Allergies: Negative.    Psychiatric/Behavioral: The patient is nervous/anxious.      Physical Exam   Constitutional: She appears well-nourished.   Eyes: Pupils are equal, round, and  reactive to light.   Neck: Normal range of motion. No thyromegaly present.   Cardiovascular: Normal rate and regular rhythm.    Pulmonary/Chest: Effort normal and breath sounds normal.   Musculoskeletal: Normal range of motion.   Lymphadenopathy:     She has no cervical adenopathy.     She has no axillary adenopathy.   Skin: Rash noted. Rash is urticarial. There is erythema.        Psychiatric: Her speech is normal and behavior is normal. Her mood appears anxious.       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]          Review of Patient's Allergies indicates:  No Known Allergies        Current outpatient prescriptions:  FERROUS SULFATE 325 (65 FE) MG OR TABS 1 TABLET  By mouth twice daily  ( ferritin 7 01/15/2009) Disp: 60 tablet Rfl: 0   CEFUROXIME AXETIL 500 MG OR TABS 1 TABLET TWICE DAILY for 20 days ( antibtioic for early  Lyme disease and enterbacter colace skin infection Disp: 28 tablet Rfl: 0   POVIDONE-IODINE 10 % EX OINT Applied to affected area daily  ( topical use ) Disp: 30 g Rfl: 3   ONDANSETRON HCL 8 MG OR TABS 1 TABLET 3 TIMES DAILY Disp: 30 Rfl: 4   PRENATAL 1 PLUS 1 OR TABS 1 TABLET DAILY ( prenatal vitamine ) Disp: 30 Rfl: 9              01/14/2009   5:25 PM   BP: 108/70   Pulse: 90   Temp: 98.2 F (36.8 C)   TempSrc: Temporal   Weight: 176 lb (79.833 kg)   SpO2: 99%         Assesment and Plan:    Claudia Lawrence is  a 33 year old female      041.85BE 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  (primary encounter diagnosis)  Comment:as per skin wound culutre ressistant to amoxicillin, sensitve to Ceftin    Plan: RBC SED RATE, NONAUTOMATED, COMPREHENSIVE         METABOLIC PANEL, CEFUROXIME AXETIL 500 MG OR         TABS              088.81 Lyme disease  Comment:c/c acute onset of abdominal rash aacfter deer tick bit at abdominal area, huband remove the head of ticks, lyme disease screening  Ordered,+ deer tick bite at abodmen after visit NH, patient 's husband did remove the tick bite ,large  6cm erythyematos patch in the beginning and then shrink to 3 cm in circumference , no target rash like , more sharply demarcated erythyema , swelling and tenderness after obtained wound culture at entry portal site    6 months pregnant , Her due day is 04/10/08       Plan: WOUND CULTURE AND GRAM STAIN, AMOXICILLIN 500         MG OR CAPS, URINALYSIS, URINE CULTURE/COLONY         COUNT, COMPLETE CBC W/AUTO DIFF WBC, RBC SED         RATE, NONAUTOMATED, COMPREHENSIVE METABOLIC         PANEL, REFERRAL TO INFECTIOUS DISEASES (INT),         EKG ** DONE BY NURSE OR MD **, LYME IGG/IGM         REFLEX WESTERN BLOT, LYME IGM AB WESTERN BLOT         REFLX, CEFUROXIME AXETIL 500 MG OR TABS              782.1BG Rash/skin eruption  Comment: c/c acute onset of abdominal rash aacfter deer tick bit at abdominal area, huband remove the head of ticks, lyme disease screening  Ordered,+ deer tick bite at abodmen after visit NH, patient 's husband did remove the tick bite ,large  6cm erythyematos patch in the beginning and then shrink to 3 cm in circumference , no target rash like , more sharply demarcated erythyema , swelling and tenderness after obtained wound culture at  entry portal site    6 months pregnant , Her due day is 04/10/08       Plan: WOUND CULTURE AND GRAM STAIN, POVIDONE-IODINE         10 % EX OINT, CEFUROXIME AXETIL 500 MG OR TABS              919.4AA Tick bites  Comment:c/c acute onset of abdominal rash aacfter deer tick bit at abdominal area, huband remove the head of ticks, lyme disease screening  Ordered,+ deer tick bite at abodmen after visit NH, patient 's husband did remove the tick bite ,large  6cm erythyematos patch in the beginning and then shrink to 3 cm in circumference , no target rash like , more sharply demarcated erythyema , swelling and tenderness after obtained wound culture at entry portal site    6 months pregnant , Her due day is 04/10/08       Plan: WOUND CULTURE AND GRAM STAIN, COMPLETE CBC         W/AUTO DIFF WBC, LYME IGG/IGM REFLEX WESTERN         BLOT, LYME IGM AB WESTERN BLOT REFLX,         CEFUROXIME AXETIL 500 MG OR TABS            V22.2C Pregnancy  Comment:  Negative urine culture   Plan: URINALYSIS, URINE CULTURE/COLONY COUNT, THYROID        STIM HORMONE              682.54M Cellulitis  Comment:  postive for enterobacter Colace   Plan: CEFUROXIME AXETIL 500 MG OR TABS              280.9AM Iron deficiency anemia  Comment:  Ferritin 7 while on prenatal vitamin , no sign of bleeding, H/H 11/36%, consider iron malabsoroption problem consider hematology consultation for possible iron IV treatment   Plan: LAB ADD ON TEST, ASSAY OF FERRITIN, FERROUS         SULFATE 325 (65 FE) MG OR TABS, REFERRAL TO         HEMATOLOGY (INT)             790.72F ESR raised  Comment:  ESR 33, may related to enterobacter colace skin infection vs tick bite   Plan:           FOLLOW UP WITH DR. Kathie Rhodes Bently Morath IN CLINIC WITH RESULT IN    4     WEEKS. PATIENT for result of Lyme titer and enterbacger Coalce cellltiis   AGREES

## 2009-01-15 LAB — EKG

## 2009-01-15 LAB — URINE CULTURE/COLONY COUNT: URINE CULTURE/COLONY COUNT: NO GROWTH

## 2009-01-16 DIAGNOSIS — R21 Rash and other nonspecific skin eruption: Secondary | ICD-10-CM | POA: Insufficient documentation

## 2009-01-16 DIAGNOSIS — A6920 Lyme disease, unspecified: Secondary | ICD-10-CM | POA: Insufficient documentation

## 2009-01-16 DIAGNOSIS — W57XXXA Bitten or stung by nonvenomous insect and other nonvenomous arthropods, initial encounter: Secondary | ICD-10-CM | POA: Insufficient documentation

## 2009-01-16 LAB — COMPREHENSIVE METABOLIC PANEL
ALANINE AMINOTRANSFERASE: 12 IU/L (ref 7–35)
ALBUMIN: 3.1 g/dl — ABNORMAL LOW (ref 3.4–4.8)
ALKALINE PHOSPHATASE: 47 IU/L (ref 25–106)
ANION GAP: 6 mmol/L (ref 2–25)
ASPARTATE AMINOTRANSFERASE: 18 IU/L (ref 8–34)
BILIRUBIN TOTAL: 0.7 mg/dl (ref 0.2–1.1)
BUN (UREA NITROGEN): 6 mg/dl (ref 6–20)
CALCIUM: 9.1 mg/dl (ref 8.6–10.3)
CARBON DIOXIDE: 25 mmol/L (ref 22–32)
CHLORIDE: 105 mmol/L (ref 101–111)
CREATININE: 0.4 mg/dl (ref 0.4–1.2)
ESTIMATED GLOMERULAR FILT RATE: >60 mL/min (ref >60)
Glucose Random: 82 mg/dl (ref 74–160)
POTASSIUM: 4.2 mmol/L (ref 3.5–5.1)
SODIUM: 136 mmol/L (ref 135–144)
TOTAL PROTEIN: 6.2 g/dl (ref 5.9–7.5)

## 2009-01-16 LAB — TSH (THYROID STIMULATING HORMONE): TSH (THYROID STIM HORMONE): 0.71 u[IU]/mL (ref 0.34–5.60)

## 2009-01-16 LAB — CHG ASSAY OF FERRITIN: FERRITIN: 8 ng/mL — ABNORMAL LOW (ref 11–307)

## 2009-01-16 NOTE — Progress Notes (Deleted)
Addended byMarigene Ehlers on: 01/16/2009      Modules accepted: Orders

## 2009-01-17 ENCOUNTER — Telehealth (HOSPITAL_BASED_OUTPATIENT_CLINIC_OR_DEPARTMENT_OTHER): Payer: Self-pay

## 2009-01-17 DIAGNOSIS — A6920 Lyme disease, unspecified: Secondary | ICD-10-CM

## 2009-01-17 DIAGNOSIS — L0390 Cellulitis, unspecified: Secondary | ICD-10-CM | POA: Insufficient documentation

## 2009-01-17 DIAGNOSIS — D509 Iron deficiency anemia, unspecified: Secondary | ICD-10-CM | POA: Insufficient documentation

## 2009-01-17 DIAGNOSIS — R700 Elevated erythrocyte sedimentation rate: Secondary | ICD-10-CM | POA: Insufficient documentation

## 2009-01-17 DIAGNOSIS — A498 Other bacterial infections of unspecified site: Principal | ICD-10-CM

## 2009-01-17 LAB — WOUND CULTURE AND GRAM STAIN: GRAM STAIN: NONE SEEN

## 2009-01-17 MED ORDER — CEFUROXIME AXETIL 500 MG PO TABS
ORAL_TABLET | ORAL | Status: DC
Start: 2009-01-17 — End: 2009-01-17

## 2009-01-17 MED ORDER — FERROUS SULFATE 325 (65 FE) MG PO TABS
ORAL_TABLET | ORAL | Status: DC
Start: 2009-01-17 — End: 2009-02-16

## 2009-01-17 MED ORDER — CEFUROXIME AXETIL 500 MG PO TABS
ORAL_TABLET | ORAL | Status: DC
Start: 2009-01-17 — End: 2009-02-10

## 2009-01-17 NOTE — Telephone Encounter (Signed)
Dear Haydee Salter Ripley Fraise and Judy/Dr. Donetta Potts    I did phone the patient  On 01/17/2009 6:37 pm and leave following voice mail messages :, can we  Call this lovely patient to make sure she did recevied the message.     1) abdoiminal skin wound culture shows enterobacter Colace, resistant to amoxicillin, sensitve to ceftin   Patient was treated Amoxicillin 500 bid for 21 days for possible Lyme disease  after deer tick bite at abdominal area with rash .the result of serology of Lyme still pending ,     2) I did phoned ID consultaiton on call DR. Bruno-Murtha 01/17/09 6:30 pm , she advise con need to change to Ceftin 500 mg bid for20 days sent it to preferred pharmacy, Ceftin also can treat early Lyme disease     3) ferritin 7 , ordered ferrous sulfate 325 mg bid  To hematoloty consultation for possible need for IRON IV treatment  4) ID consultation ordered for patient     Thank you !

## 2009-01-17 NOTE — Progress Notes (Addendum)
Addended by: Akio Hudnall on: 01/17/2009      Modules accepted: Orders

## 2009-01-18 LAB — LYME IGM AB WESTERN BLOT REFLX: LYME DISEASE AB QUANT IGM: <0.91 index (ref 0.00–0.90)

## 2009-01-18 LAB — LYME IGG/IGM LINE BLOT REFLEX: LYME AB TOTAL IMMUNOGLOBULINS: <0.91 index (ref 0.00–0.90)

## 2009-01-18 NOTE — Telephone Encounter (Signed)
Per pt, she got the message from Dr. Nedra Hai yesterday and already taking the new ambx.  Pt wants to know if Dr. Vilinda Blanks knows about this new ambx.    Pt informed message was sent to Domingues.

## 2009-01-21 ENCOUNTER — Ambulatory Visit (HOSPITAL_BASED_OUTPATIENT_CLINIC_OR_DEPARTMENT_OTHER): Payer: PRIVATE HEALTH INSURANCE

## 2009-01-21 ENCOUNTER — Encounter (HOSPITAL_BASED_OUTPATIENT_CLINIC_OR_DEPARTMENT_OTHER): Payer: Self-pay

## 2009-01-21 VITALS — BP 110/64 | Wt 176.0 lb

## 2009-01-21 DIAGNOSIS — R1110 Vomiting, unspecified: Secondary | ICD-10-CM

## 2009-01-21 DIAGNOSIS — O0990 Supervision of high risk pregnancy, unspecified, unspecified trimester: Principal | ICD-10-CM

## 2009-01-21 DIAGNOSIS — N3090 Cystitis, unspecified without hematuria: Secondary | ICD-10-CM

## 2009-01-21 DIAGNOSIS — Z7189 Other specified counseling: Principal | ICD-10-CM

## 2009-01-21 DIAGNOSIS — Z3009 Encounter for other general counseling and advice on contraception: Principal | ICD-10-CM

## 2009-01-21 LAB — CHG HEPATIC FUNCTION PANEL
ALANINE AMINOTRANSFERASE: 7 IU/L (ref 7–35)
ALBUMIN: 2.9 g/dl — ABNORMAL LOW (ref 3.4–4.8)
ALKALINE PHOSPHATASE: 52 IU/L (ref 25–106)
ASPARTATE AMINOTRANSFERASE: 15 IU/L (ref 8–34)
BILIRUBIN DIRECT: 0.1 mg/dl (ref 0.0–0.2)
BILIRUBIN TOTAL: 0.5 mg/dl (ref 0.2–1.1)
INDIRECT BILIRUBIN: 0.4 mg/dl (ref 0.2–0.9)
TOTAL PROTEIN: 6 g/dl (ref 5.9–7.5)

## 2009-01-21 LAB — CBC WITH PLATELET
HEMATOCRIT: 32.4 % — ABNORMAL LOW (ref 36.0–48.0)
HEMOGLOBIN: 11.1 g/dl — ABNORMAL LOW (ref 12.0–16.0)
MEAN CORP HGB CONC: 34.4 g/dl (ref 32.0–36.0)
MEAN CORPUSCULAR HGB: 28.7 pg (ref 27.0–33.0)
MEAN CORPUSCULAR VOL: 83.5 fl (ref 80.0–100.0)
MEAN PLATELET VOLUME: 8.6 fl (ref 6.4–10.8)
PLATELET COUNT: 233 10*3/uL (ref 150–400)
RBC DISTRIBUTION WIDTH: 13.4 % (ref 11.5–14.3)
RED BLOOD CELL COUNT: 3.88 M/uL — ABNORMAL LOW (ref 4.50–5.10)
WHITE BLOOD CELL COUNT: 8.1 10*3/uL (ref 4.0–10.8)

## 2009-01-21 LAB — GLUCOSE 1 HR POST 50 GRAM DOSE: GLUCOSE 1 HR POST 50 GRAM DOSE: 106 mg/dl (ref ?–139)

## 2009-01-21 LAB — FETAL FIBRONECTIN: FETAL FIBRONECTIN: NEGATIVE

## 2009-01-21 MED ORDER — NITROFURANTOIN MONOHYD MACRO 100 MG PO CAPS
ORAL_CAPSULE | ORAL | Status: DC
Start: 2009-01-22 — End: 2009-04-22

## 2009-01-21 NOTE — Progress Notes (Signed)
PRENATAL PHYCHOSOCIAL ASSESSMENT    Intake Date: October 28th, 2010  Provider: Melvenia Needles - FSW    Obstetric History   G7  P2  T2  P0  A4  E0  M0  L2      EDC: 04/20/2009    PRIMARY LANGUAGE: [place]  EDUCATION LEVEL: College degree, in [place].  CURRENT EMPLOYMENT: Counsellor.  WORK HISTORY: None  YEARS IN [place]: 6 years  ENGLISH: Yes, only basic english.    ETHNICITY: [place] ([place], [place]  READ & WRITE: Yes, only primary language.  MARITAL STATUS: Married (12 years) and living with husband.    HEALTH INSURANCE INFORMATION: [employer] and [employer].    MOTHER'S INFORMATION    FEELINGS ABOUT PRESENT PREGNANCY:  Planned pregnancy, pt is very happy that she will      have a baby-boy after two daughters.    ANTICIPATION OF PROBLEMS/ISSUES: No issues presented today.    SUPPORT SYSTEMS:  Husband is very supportive. Pt has many supportive friends from her      church. Pt doesn't have family in [place]. Husband has a married brother living in the [place] area.    PREVIOUS PREGNANCY HX:  Two healthy pregnancies, in [place].    HX MEDICAL/MENTAL HEALTH/ILLNESS: No Hx. Pt is aware about postpartum depression.    HX OF SUBSTANCE USE (CIGARETTE, ETOH, OTHER): None    INFORMATION ON FATHER OF BABY    IS BABY'S FATHER WITH YOU?  Yes, at the lab waiting-area.    NAME:  Gerlean Ren                                   AGE:  32         WORK STATUS: Owns a IT consultant.    DO YOU WANT TO INVOLVE FATHER WITH PRENATAL CARE? Yes, he is always present at      prenatal appointments.     HX OF SUBSTANCE USE (CIGARETTE, ETOH, OTHER): None    HX OF DOMESTIC VIOLENCE (EMOTIONAL/PHYSICAL):  Pt denies DV.    INFORMATION ON OTHER CHILDREN: Couple has two daughters Hessie Knows 32 years old    and [place] 32 years old) who receive pediatric care at [facility] (provider: Leta Jungling Dworkind).    BASIC NEEDS AND RESOURCES    WIC: Pt is already enrolled in the program.    Transportation to the clinic: Husband always  drive.    PRESENT LIVING SITUATION (INCLUDING NUMBER OF PEOPLE IN HOUSEHOLD):  Family     doesn't have roommates.  ANTICIPATE NEEDING TO MOVE PRIOR TO DELIVERY (OR SHORTLY AFTER BIRTH): Couple     is looking for a bigger apartment with three bedrooms. Present apartment is too small for the     family.  WHO WILL HELP YOU AT HOME AFTER THE BIRTH OF YOUR CHILD? Pt's husband.  HAVE YOU GIVEN ANY THOUGHT TO HOW YOU ARE GOING TO FEED THE BABY?  Pt intends     to breast-feed the baby.

## 2009-01-21 NOTE — Progress Notes (Signed)
FAMILY PLANNING COUNSELING NOTE    Patient's LMP from OB Dating Form was 07/14/2008.  Obstetric History   G7  P2  T2  P0  A4  E0  M0  L2     Sexual History Reviewed:  Length of time with current partner: 12 yrs- monogamous relationship-denies DV/[place]  Any new partners in the  past year (pt or partner): no  Past history of STD's:dicsussed  Sexual partners: Male  Sexual practices: Vaginal, Oral    Risk Factors Reviewed:  STD's/HIV  Pregnancy  Tobacco  Alcohol  Drugs  Violence    Education/Counseling Provided:  Contraceptive overview  Method specific contraceptive overview: IUD, Implanon and Depo-Provera injections, Risks, Warning Signs, Benefits, Side Effects, Effectiveness, Instructions  Emergency contraception  STD/HIV/Safer Sexual Practices  Informed Consent signed    Comments:  Claudia Lawrence is a 33 year old female. She is from [place], and the interview is conducted in [place].  Patient presents with:    Family Planning  PN 27 wks for [facility] options pos partum.  Options counseled and written info provided.  Pt does not know which method to use after baby birth, will inform provided when ready.

## 2009-01-25 ENCOUNTER — Ambulatory Visit (HOSPITAL_BASED_OUTPATIENT_CLINIC_OR_DEPARTMENT_OTHER): Payer: Self-pay

## 2009-01-25 DIAGNOSIS — Z3480 Encounter for supervision of other normal pregnancy, unspecified trimester: Secondary | ICD-10-CM

## 2009-01-25 DIAGNOSIS — Z3689 Encounter for other specified antenatal screening: Principal | ICD-10-CM

## 2009-01-25 NOTE — Telephone Encounter (Addendum)
Chiann Domingues    Sent Tuesday January 19, 2009  8:46 AM   To Ova Freshwater          Message Yes, I am aware  Thanks  ----- Message -----   From: Ova Freshwater   Sent: 01/18/2009  9:04 AM    To: Donetta Potts, Eileen Stanford     Pt notified Dr. Vilinda Blanks is aware

## 2009-02-07 LAB — US OB FOLLOW UP

## 2009-02-10 ENCOUNTER — Ambulatory Visit (HOSPITAL_BASED_OUTPATIENT_CLINIC_OR_DEPARTMENT_OTHER): Payer: PRIVATE HEALTH INSURANCE | Admitting: Hematology & Oncology

## 2009-02-10 ENCOUNTER — Ambulatory Visit (HOSPITAL_BASED_OUTPATIENT_CLINIC_OR_DEPARTMENT_OTHER): Payer: Self-pay | Admitting: Hematology & Oncology

## 2009-02-10 ENCOUNTER — Ambulatory Visit (HOSPITAL_BASED_OUTPATIENT_CLINIC_OR_DEPARTMENT_OTHER): Payer: PRIVATE HEALTH INSURANCE

## 2009-02-10 VITALS — BP 112/60 | Wt 179.0 lb

## 2009-02-10 VITALS — BP 100/60 | HR 91 | Temp 99.0°F | Wt 178.0 lb

## 2009-02-10 DIAGNOSIS — R1110 Vomiting, unspecified: Secondary | ICD-10-CM

## 2009-02-10 DIAGNOSIS — D649 Anemia, unspecified: Principal | ICD-10-CM

## 2009-02-10 DIAGNOSIS — O0990 Supervision of high risk pregnancy, unspecified, unspecified trimester: Principal | ICD-10-CM

## 2009-02-10 MED ORDER — PROTONIX 40 MG PO TBEC
DELAYED_RELEASE_TABLET | ORAL | Status: DC
Start: 2009-02-10 — End: 2009-02-25

## 2009-02-10 NOTE — Progress Notes (Signed)
Patient just started oral iron supplementation once a dau. Suggested taking it twice a day. Patient currently has some nausea but does not know if it is related to iron supplementation or just related to pregnancy. If nausea worsens with increase in oral iron, patient to call me so that we can schedule her for IV iron. Otherwise suggest repeating cbc and ferritin in 4 weeks.

## 2009-02-11 ENCOUNTER — Telehealth (HOSPITAL_BASED_OUTPATIENT_CLINIC_OR_DEPARTMENT_OTHER): Payer: Self-pay

## 2009-02-11 DIAGNOSIS — Z8759 Personal history of other complications of pregnancy, childbirth and the puerperium: Secondary | ICD-10-CM | POA: Insufficient documentation

## 2009-02-11 NOTE — Telephone Encounter (Signed)
Spoke with pt's husband  Informed him of U/S findings and that we will proceed with U/S done by MFM on 12/2 at [facility] at 0930  He and pt agree with come to appt and see me the same day after appt complete  Bereket Gernert C.Kelie Gainey, MD

## 2009-02-15 ENCOUNTER — Telehealth (HOSPITAL_BASED_OUTPATIENT_CLINIC_OR_DEPARTMENT_OTHER): Payer: Self-pay | Admitting: Registered Nurse

## 2009-02-15 ENCOUNTER — Telehealth (HOSPITAL_BASED_OUTPATIENT_CLINIC_OR_DEPARTMENT_OTHER): Payer: Self-pay

## 2009-02-15 NOTE — Telephone Encounter (Signed)
Please schedule her for one venofer infusion 300 mg with appt to see me in 4 weeks. Ask her to continue oral iron once a day like she was doing before.

## 2009-02-15 NOTE — Telephone Encounter (Signed)
Call to patient. Spoke with husband. Concerned as Claudia Lawrence with more stomach upset and dizzy since taking iron pills. Drinking well, but always complaining of "she's dizzy". Was told to f/u with blood doctor if not doing well with oral iron, but wants to discuss if iron injections are a good "option" with Dr Vilinda Blanks. "We always feel better after discussing her treatment plan with Dr Vilinda Blanks first".  Nurse to relay to Dr Vilinda Blanks for f/u. Relate patient's symptoms with iron pills.  Husband to bring patient in tomorrow eve (per his request) for b/p check and eval. To go to local drug store today to see if dizziness r/t her b/p. "though she's been drinking and voiding ok".      Dr Vilinda Blanks?

## 2009-02-15 NOTE — Telephone Encounter (Signed)
Patient husband calls to report that patient has been taking Iron 2 tabs once a day and has been having nausea and dizziness. Husband reports that he spoke to [facility] MD and reports that she should have Iron infusion.   Will notify Dr Joneen Caraway

## 2009-02-15 NOTE — Telephone Encounter (Signed)
Message copied by Olevia Bowens on Mon Feb 15, 2009 10:53 AM  ------       Message from: Murvin Donning       Created: Mon Feb 15, 2009 10:43 AM       Regarding: sick  call - dizziness and vomiting         [facility]              [place] [id number], 33 year old, female, Telephone Information:       Home Phone      [phone]       Work Phone      Not on file.       Mobile          [phone]                     CALL BACK NUMBER:[phone]       Best time to call back: anytime       Cell phone:        Other phone:              Available times:              Patient's language of care: [place]              Patient does not need an interpreter.              Patient's PCP: Eileen Stanford, MD              Person calling on behalf of patient: Husband - Domingo Cocking              Calls today with sick call-  Pt 'husband called and said his wife is feeling dizziness with vomiting  since last Friday. Patient is [redacted] weeks pregnant. Please advise.              Thanks                     Patient's Preferred Pharmacy:        [facility] [address] [facility]              Phone: [phone] Fax: [phone]              [facility] ([facility])              Phone: [phone] Fax: [phone]

## 2009-02-15 NOTE — Telephone Encounter (Signed)
Spoke with patient husband  toset up pointment for Venofer infusion and appointment with Dr Joneen Caraway in one month. Appointment for Venofer on 02/16/09 at 3 pm for Venofer and appointment for Dr Joneen Caraway on 03/10/09 at 11 am for follow up. Patient husband given teaching follow up care and verbalizes knowledge and understanding of follow up plan.

## 2009-02-15 NOTE — Telephone Encounter (Signed)
Message copied by Joesph July on Mon Feb 15, 2009 11:52 AM  ------       Message from: Servando Salina       Created: Mon Feb 15, 2009 11:33 AM       Regarding: ? I.V . FOR HER IRON LEVEL - MISRA       Contact: [phone]         [facility]              Claudia Lawrence [id number], 33 year old, female, Telephone Information:       Home Phone      [phone]       Work Phone      Not on file.       Mobile          [phone]                     Cleotis Lema NUMBER: [phone]       Best time to call back: --       Cell phone:        Other phone:              Available times:              Patient's language of care: [place]              Patient does not need an interpreter.              Patient's PCP: Eileen Stanford, MD              Person calling on behalf of patient: Spouse              Calls today with questions and concerns.HUSBAND CALLING  EDUARDO , ? SHOULD PT. HAVE I.V. THERAPY FOR HER LOW IRON LEVEL PT. IS [redacted] WKS PREGNANT.               Patient's Preferred Pharmacy:        [facility] [address] [facility]              Phone: [phone] Fax: [phone]              [facility] ([facility])              Phone: [phone] Fax: [phone]

## 2009-02-15 NOTE — Telephone Encounter (Signed)
Call to husband. He was advised.

## 2009-02-15 NOTE — Telephone Encounter (Signed)
They are a lovely couple  I would not recommend parenteral iron with her anemia level  I want her to take the iron orally (with food);it should not make her dizzy  I will see them next week  OK to proceed with RN visit as planned  Thanks  AD

## 2009-02-16 ENCOUNTER — Ambulatory Visit (HOSPITAL_BASED_OUTPATIENT_CLINIC_OR_DEPARTMENT_OTHER): Payer: PRIVATE HEALTH INSURANCE | Admitting: Infusion

## 2009-02-16 ENCOUNTER — Encounter (HOSPITAL_BASED_OUTPATIENT_CLINIC_OR_DEPARTMENT_OTHER): Payer: Self-pay | Admitting: Registered Nurse

## 2009-02-16 ENCOUNTER — Ambulatory Visit (HOSPITAL_BASED_OUTPATIENT_CLINIC_OR_DEPARTMENT_OTHER): Payer: PRIVATE HEALTH INSURANCE

## 2009-02-16 DIAGNOSIS — D649 Anemia, unspecified: Principal | ICD-10-CM

## 2009-02-16 NOTE — Progress Notes (Signed)
Pt reports she had her Iron infusion today.  No further C\O's dizziness since yesterday, feels fine today.  BP 120\80 now.  Advised to drink plenty of fluids, pt reports she stopped Iron 4 days ago D\T nausea, per Dr. Joneen Lawrence.  Todays visit Dr. Joneen Lawrence advised pt to take Iron 1 tab daily,per Dr. Vilinda Blanks to continue BID  .  Will check with Dr. Vilinda Blanks for order.  Pt scheduled for repeat infusion in 1 month.  Will discuss plan with OB provider & inform pt.  The patient indicates understanding of these issues and agrees with the plan.

## 2009-02-16 NOTE — Progress Notes (Signed)
1500  Presents to clinic for IV venofer infusion.  #22g Intima started right A/C with good blood return.  Venofer desired effects and possible adverse effects reviewed with pt.  Written information given and reviewed.  Verbalizes understanding and agrees with plan.  Venofer 300mg  in 250cc Normal Saline infused over 1 hour without adverse effect noted.  IV flushed with Normal Saline and D/C'd.  Site benign.  Pt encouraged to increase fluid intake x 24 hours and notify MD/RN of any adverse effects.  Verbalizes understanding.  D/C home, ambulatory 1630.  F/U with Dr Joneen Caraway 12/15.

## 2009-02-25 ENCOUNTER — Ambulatory Visit (HOSPITAL_BASED_OUTPATIENT_CLINIC_OR_DEPARTMENT_OTHER): Payer: PRIVATE HEALTH INSURANCE

## 2009-02-25 ENCOUNTER — Ambulatory Visit (HOSPITAL_BASED_OUTPATIENT_CLINIC_OR_DEPARTMENT_OTHER): Payer: PRIVATE HEALTH INSURANCE | Admitting: Maternal & Fetal Medicine

## 2009-02-25 ENCOUNTER — Ambulatory Visit (HOSPITAL_BASED_OUTPATIENT_CLINIC_OR_DEPARTMENT_OTHER): Payer: Self-pay

## 2009-02-25 ENCOUNTER — Encounter (HOSPITAL_BASED_OUTPATIENT_CLINIC_OR_DEPARTMENT_OTHER): Payer: Self-pay

## 2009-02-25 ENCOUNTER — Ambulatory Visit (HOSPITAL_BASED_OUTPATIENT_CLINIC_OR_DEPARTMENT_OTHER): Payer: Self-pay | Admitting: Obstetrics & Gynecology

## 2009-02-25 VITALS — BP 92/56 | Wt 179.0 lb

## 2009-02-25 DIAGNOSIS — O0990 Supervision of high risk pregnancy, unspecified, unspecified trimester: Principal | ICD-10-CM

## 2009-02-25 DIAGNOSIS — Z8759 Personal history of other complications of pregnancy, childbirth and the puerperium: Secondary | ICD-10-CM | POA: Insufficient documentation

## 2009-02-25 DIAGNOSIS — O3500X0 Maternal care for (suspected) central nervous system malformation or damage in fetus, unspecified, not applicable or unspecified: Secondary | ICD-10-CM

## 2009-02-25 DIAGNOSIS — O350XX0 Maternal care for (suspected) central nervous system malformation in fetus, not applicable or unspecified: Principal | ICD-10-CM

## 2009-02-25 LAB — US OB HIGH RISK

## 2009-02-25 LAB — HOLD PURPLE TOP TUBE

## 2009-02-25 NOTE — Progress Notes (Signed)
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 She is a 33 year old G19P2 at 29 weeks' gestation. Her pregnancy has been complicated by UTI, a tick bite treated prophylactically, and a +treponemal antibody which was treated but not definitively thought to be a true infection. Her obstetrical history is significant for a pregnancy loss at "5 months" in [place] which was complicated by fetal hydrocephaly, and reportedly, infection. Her Integrated screen was negative.    On exam biometry is consistent with dates. 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 No other intracranial anomalies were identified. The third and fourth ventricles appeared normal, and the corpus callosum was identified. The remainder of the anatomic survey showed no abnormalities. The fluid volume appeared normal. The placenta has at least 2 large lakes; the significance of these are not clear.    I discussed the findings and explained the possible etiologies for unilateral ventriculomegaly, including normal variant, infection (CMV or toxo), other intracranial abnormalities, such as a bleed, cyst or mass (none were identified but I explained U/S does not rule out all such findings), aneuploidy, which is unlikely given the unilateral nature and normal integrated screen (1:10,000 risk of Down syndrome). I explained the ventricular system is dynamic so we should reevaluate the ventricles in 2 weeks. I recommended testing for CMV and toxo, and discussed this with Dr. Vilinda Blanks by phone. (CMV IgG and IgM, Toxo IgG, urine for CMV culture or PCR.)    Thank you for allowing [place] to share in her care.  [facility] MD  Maternal Fetal Medicine

## 2009-02-27 LAB — TOXOPLASMA IGG ANTIBODY: TOXOPLASMA IgG ANTIBODY: <6.5 IU/mL (ref 0.0–6.4)

## 2009-02-27 LAB — TOXOPLASMA IGM ANTIBODY: TOXOPLASMA IgM ANTIBODY: <0.9 index (ref 0.0–0.8)

## 2009-03-03 ENCOUNTER — Telehealth (HOSPITAL_BASED_OUTPATIENT_CLINIC_OR_DEPARTMENT_OTHER): Payer: Self-pay | Admitting: Registered Nurse

## 2009-03-03 LAB — CYTOMEGALOVIRUS DNA PCR: CYTOMEGALOVIRUS DNA PCR: NEGATIVE copies/mL

## 2009-03-03 LAB — CYTOMEGALOVIRUS IGG: CYTOMEGALOVIRUS IgG: 13 index — AB (ref 0.0–0.8)

## 2009-03-03 LAB — CYTOMEGALOVIRUS IGM: CYTOMEGALOVIRUS IgM: <0.9 index (ref 0.0–0.8)

## 2009-03-03 NOTE — Telephone Encounter (Signed)
Message copied by Olevia Bowens on Wed Mar 03, 2009  3:13 PM  ------       Message from: Larena Glassman       Created: Wed Mar 03, 2009  3:00 PM       Regarding: P/n pt rash all over belly       Contact: [phone]         [facility]              Mayrin Schmuck [id number], 33 year old, female, Telephone Information:       Home Phone      [phone]       Work Phone      Not on file.       Mobile          [phone]                     Cleotis Lema NUMBER: [phone]       Best time to call back:        Cell phone:        Other phone:              Available times:              Patient's language of care: [place]              Patient does not need an interpreter.              Patient's PCP: Eileen Stanford, MD              Person calling on behalf of patient: Patient (self)              Calls today        P/n pt rash all over belly.              Patient's Preferred Pharmacy:        [facility] [address] [facility]              Phone: [phone] Fax: [phone]              [facility] ([facility])              Phone: [phone] Fax: [phone]

## 2009-03-03 NOTE — Telephone Encounter (Signed)
Call to patient. Reports noted new small,pimple like rash on stomach area past few days. Denies new foods/soaps/detergents etc. Was using an otc cream "for a while" but did d/c that "in case it was r/t that".  Afeb. No other symptoms. Will try otc Hydrocortisone and f/u if sx continue/worsen/change. Patient agrees.

## 2009-03-09 ENCOUNTER — Telehealth (HOSPITAL_BASED_OUTPATIENT_CLINIC_OR_DEPARTMENT_OTHER): Payer: Self-pay | Admitting: Advanced Practice Midwife

## 2009-03-09 LAB — CYTOMEGALOVIRUS CULTURE

## 2009-03-09 NOTE — Telephone Encounter (Signed)
Lm with pts husband with new appt date and time.

## 2009-03-10 ENCOUNTER — Ambulatory Visit (HOSPITAL_BASED_OUTPATIENT_CLINIC_OR_DEPARTMENT_OTHER): Payer: PRIVATE HEALTH INSURANCE

## 2009-03-10 ENCOUNTER — Ambulatory Visit (HOSPITAL_BASED_OUTPATIENT_CLINIC_OR_DEPARTMENT_OTHER): Payer: PRIVATE HEALTH INSURANCE | Admitting: Hematology & Oncology

## 2009-03-10 ENCOUNTER — Encounter (HOSPITAL_BASED_OUTPATIENT_CLINIC_OR_DEPARTMENT_OTHER): Payer: Self-pay | Admitting: Advanced Practice Midwife

## 2009-03-10 ENCOUNTER — Ambulatory Visit (HOSPITAL_BASED_OUTPATIENT_CLINIC_OR_DEPARTMENT_OTHER): Payer: PRIVATE HEALTH INSURANCE | Admitting: Registered"

## 2009-03-10 ENCOUNTER — Ambulatory Visit (HOSPITAL_BASED_OUTPATIENT_CLINIC_OR_DEPARTMENT_OTHER): Payer: PRIVATE HEALTH INSURANCE | Admitting: Advanced Practice Midwife

## 2009-03-10 ENCOUNTER — Ambulatory Visit (HOSPITAL_BASED_OUTPATIENT_CLINIC_OR_DEPARTMENT_OTHER): Payer: Self-pay | Admitting: Advanced Practice Midwife

## 2009-03-10 VITALS — BP 100/52 | Wt 180.0 lb

## 2009-03-10 VITALS — BP 100/56 | HR 84 | Wt 177.0 lb

## 2009-03-10 DIAGNOSIS — O0990 Supervision of high risk pregnancy, unspecified, unspecified trimester: Secondary | ICD-10-CM

## 2009-03-10 DIAGNOSIS — D649 Anemia, unspecified: Principal | ICD-10-CM

## 2009-03-10 DIAGNOSIS — O2686 Pruritic urticarial papules and plaques of pregnancy (PUPPP): Principal | ICD-10-CM

## 2009-03-10 MED ORDER — HYDROCORTISONE 2.5 % EX CREA
TOPICAL_CREAM | CUTANEOUS | Status: DC
Start: 2009-03-10 — End: 2009-04-22

## 2009-03-10 NOTE — Progress Notes (Signed)
Marland Kitchen  PRENATAL NUTRITION ASSESSMENT AND CARE PLAN    Claudia Lawrence is at visit for nutrition prenatal care. Patient is 33 year old.    The patient is from [place].  Patient's language is [place]; She has been in the [place] for > 5 years.    Visit without interpreter.    S: PARTICIPANT'S COMMENTS    Patient reported: No complaints.    Food allergies: No known allergies  Food intolerances: None  Work/School: Yes - [employer]   Living Situation: with husband and with young children age 63 12   Infant feeding Choice: breast  Previous infant feeding: breast    O:  Obstetric History   G7  P2  T2  P0  A4  E0  M0  L2      EDD: Estimated Date of Delivery: 04/20/09  Weeks of gestation: There is no pregnancy episode linked to this encounter.  Ht: Most Recent Height Reading(s)  12/14/2008 : 5\' 1"  (1.549 m)    Pregravid Wt:   Pre-pregnancy weight is considered: Normal  Wt: Most Recent Weight Reading(s)  03/10/2009 : 177 lb (80.287 kg)      Weight change: Gained 10 pounds in the past 35 week(s).      Component Value Date   HCT 32.4* 01/21/2009       Component Value Date   HGB 11.1* 01/21/2009       Component Value Date   GLUCOSER 82 01/14/2009       No results found for this basename: glucosef:1    Component Value Date   GLUCOSE1HR50 106 01/21/2009       No results found for this basename: glucose2hr:1    No results found for this basename: glucose3hr:1  Most Recent BP Reading(s)  03/10/2009 : 100/56    Patient has history of: Multiple pregnancies.  Vitamin/Mineral Supplements: Pre-Natal Vitamin.    Medications:     Current outpatient prescriptions:  NITROFURANTOIN MONOHYD MACRO 100 MG OR CAPS 1 CAPSULE NIGHTLY Disp: 60 capsule Rfl: 5   PRENATAL 1 PLUS 1 OR TABS 1 TABLET DAILY ( prenatal vitamine ) Disp: 30 Rfl: 9       Drinking   Current: No alcoholic drinks.  3 months prior: No alcoholic drinks.  Smoking  Current: 0 cigarette(s) per day.  3 months prior: 0 cigarette(s) per day.  Physical Activity:  Type: physically demanding  work  Barriers to physical activity include: lack of motivation  Date of last delivery/term: 11 YEARS AGO   Date of first prenatal visit:     Food availability (questions to be completed by [facility]):  1.                         2.                        3.                         4.    A: ASSESSMENT OF DIET:  Calories: Adequate  Protein: Adequate  Calcium: Adequate  Iron: Adequate  Vitamin A:  Adequate  Vitamin C: Adequate  Water: Adequate  Fiber: Adequate  Sugar:Adequate  Activity: Adequate    ASSESSMENT OF NUTRITIONAL RISK   Inadequate diet  Low hematocrit  Low hemoglobin  Patient's weight gain is: within recommended range    P: COUNSELING/EDUCATION  Receptive to counseling: yes  Discussed  with patient:   Breastfeeding    Focus Points: Increase iron rich protein by 1 servings.  Materials provided: Breast feeding guidelines    Source of Nutrition Services:  Health Center: [facility]  [facility] Site/number: Not currently enrolled and not interested or eligible for [facility].    Referrals  [place]       NUTR FOLLOW-UP:  [facility] Follow up in  3 month(s) POSTPARTUM     _____________________________________________       Date: 03/10/2009   Provider signature  Dannielle Karvonen    Facility/Agency: [facility]          Phone: [phone]

## 2009-03-10 NOTE — Progress Notes (Signed)
Note will be in "other" section in EPIC. Recommend additional dose of Venofer 300 mg infusion on 03/11/09.

## 2009-03-11 ENCOUNTER — Ambulatory Visit (HOSPITAL_BASED_OUTPATIENT_CLINIC_OR_DEPARTMENT_OTHER): Payer: PRIVATE HEALTH INSURANCE | Admitting: Maternal & Fetal Medicine

## 2009-03-11 ENCOUNTER — Ambulatory Visit (HOSPITAL_BASED_OUTPATIENT_CLINIC_OR_DEPARTMENT_OTHER): Payer: PRIVATE HEALTH INSURANCE

## 2009-03-11 ENCOUNTER — Ambulatory Visit (HOSPITAL_BASED_OUTPATIENT_CLINIC_OR_DEPARTMENT_OTHER): Payer: Self-pay

## 2009-03-11 DIAGNOSIS — O350XX0 Maternal care for (suspected) central nervous system malformation in fetus, not applicable or unspecified: Principal | ICD-10-CM

## 2009-03-11 DIAGNOSIS — O3500X0 Maternal care for (suspected) central nervous system malformation or damage in fetus, unspecified, not applicable or unspecified: Secondary | ICD-10-CM

## 2009-03-11 LAB — CHG HEPATIC FUNCTION PANEL
ALANINE AMINOTRANSFERASE: 13 IU/L (ref 7–35)
ALBUMIN: 3 g/dl — ABNORMAL LOW (ref 3.4–4.8)
ALKALINE PHOSPHATASE: 89 IU/L (ref 25–106)
ASPARTATE AMINOTRANSFERASE: 18 IU/L (ref 8–34)
BILIRUBIN DIRECT: 0.1 mg/dl (ref 0.0–0.2)
BILIRUBIN TOTAL: 0.8 mg/dl (ref 0.2–1.1)
INDIRECT BILIRUBIN: 0.7 mg/dl (ref 0.2–0.9)
TOTAL PROTEIN: 6.2 g/dl (ref 5.9–7.5)

## 2009-03-11 LAB — US OB HIGH RISK

## 2009-03-11 NOTE — Progress Notes (Signed)
Claudia Lawrence was seen for follow up evaluation of fetal unilateral ventriculomegaly. She is currently at 35 1/7 weeks' gestation.    On exam there has been normal interval growth. The anatomic survey again shows left ventriculomegaly (1.3 mm) and no other abnormalities. The fluid volume appears normal. The BPP was 8/8. Please see Radiology Report.    Her CMV IgG was positive, but IgM was negative, and CMV PCR was negative, suggesting old infection (not current).     I discussed options for antenatal consultation with pediatric neurology, which she declined, and fetal MRI. Given the late gestational age, she wants to wait for neonatal evaluation.    I would recommend repeat evaluation in 3-4 weeks (prior to planned RCS). I would also recommend discussing plans for neonatal evaluation with pediatrics.     Thank you for allowing [place] to share in her care.  [facility] MD  Maternal Fetal Medicine

## 2009-03-15 LAB — BILE ACIDS: BILE ACIDS: 10.9 umol/L (ref 4.5–24.6)

## 2009-03-16 ENCOUNTER — Ambulatory Visit (HOSPITAL_BASED_OUTPATIENT_CLINIC_OR_DEPARTMENT_OTHER): Payer: PRIVATE HEALTH INSURANCE | Admitting: Infectious Diseases

## 2009-03-17 ENCOUNTER — Encounter (HOSPITAL_BASED_OUTPATIENT_CLINIC_OR_DEPARTMENT_OTHER): Payer: Self-pay | Admitting: Registered Nurse

## 2009-03-17 ENCOUNTER — Ambulatory Visit (HOSPITAL_BASED_OUTPATIENT_CLINIC_OR_DEPARTMENT_OTHER): Payer: PRIVATE HEALTH INSURANCE | Admitting: Infusion

## 2009-03-17 ENCOUNTER — Encounter (HOSPITAL_BASED_OUTPATIENT_CLINIC_OR_DEPARTMENT_OTHER): Payer: Self-pay | Admitting: Cardiology

## 2009-03-17 DIAGNOSIS — D649 Anemia, unspecified: Principal | ICD-10-CM

## 2009-03-17 LAB — HEMOGLOBIN: HEMOGLOBIN: 11.3 g/dl — ABNORMAL LOW (ref 12.0–16.0)

## 2009-03-17 LAB — CHG ASSAY OF FERRITIN: FERRITIN: 15 ng/mL (ref 11–307)

## 2009-03-17 NOTE — Progress Notes (Signed)
9:45 - 11:45  Pt present to clinic for venofer for anemia - c/o feeling tired - denies safety issues at home.  Pt has 2 other children at home.   Labs drawn right AC -  #22 gauge HL placed right AC.  VSS -  venofer 300 mg in 250 ml of NS infused over 1 hour -tolerated well.  Drank 240 cc ginger ale - post VSS -  104/62 - 74.  HL Dc'd - DC'd home ambulatory with husband driving.  Pt states she is scheduled for C-section 04/13/09 -   Will f/u as scheduled by MD.

## 2009-03-25 ENCOUNTER — Encounter (HOSPITAL_BASED_OUTPATIENT_CLINIC_OR_DEPARTMENT_OTHER): Payer: Self-pay

## 2009-03-25 ENCOUNTER — Ambulatory Visit (HOSPITAL_BASED_OUTPATIENT_CLINIC_OR_DEPARTMENT_OTHER): Payer: PRIVATE HEALTH INSURANCE

## 2009-03-25 VITALS — BP 100/58 | Wt 181.0 lb

## 2009-03-25 DIAGNOSIS — Z8759 Personal history of other complications of pregnancy, childbirth and the puerperium: Secondary | ICD-10-CM | POA: Insufficient documentation

## 2009-03-25 DIAGNOSIS — D649 Anemia, unspecified: Secondary | ICD-10-CM

## 2009-03-25 DIAGNOSIS — O0990 Supervision of high risk pregnancy, unspecified, unspecified trimester: Principal | ICD-10-CM

## 2009-03-25 LAB — CBC WITH PLATELET
HEMATOCRIT: 34.3 % — ABNORMAL LOW (ref 36.0–48.0)
HEMOGLOBIN: 11.6 g/dl — ABNORMAL LOW (ref 12.0–16.0)
MEAN CORP HGB CONC: 33.8 g/dl (ref 32.0–36.0)
MEAN CORPUSCULAR HGB: 28.5 pg (ref 27.0–33.0)
MEAN CORPUSCULAR VOL: 84.3 fl (ref 80.0–100.0)
MEAN PLATELET VOLUME: 9.2 fl (ref 6.4–10.8)
PLATELET COUNT: 210 10*3/uL (ref 150–400)
RBC DISTRIBUTION WIDTH: 14 % (ref 11.5–14.3)
RED BLOOD CELL COUNT: 4.07 M/uL — ABNORMAL LOW (ref 4.50–5.10)
WHITE BLOOD CELL COUNT: 9.6 10*3/uL (ref 4.0–10.8)

## 2009-03-26 LAB — GENITAL GROUP B STREP

## 2009-03-28 DIAGNOSIS — Z8759 Personal history of other complications of pregnancy, childbirth and the puerperium: Secondary | ICD-10-CM | POA: Insufficient documentation

## 2009-03-30 ENCOUNTER — Ambulatory Visit (HOSPITAL_BASED_OUTPATIENT_CLINIC_OR_DEPARTMENT_OTHER): Payer: PRIVATE HEALTH INSURANCE

## 2009-03-30 VITALS — BP 120/60 | Wt 179.0 lb

## 2009-03-30 DIAGNOSIS — O0990 Supervision of high risk pregnancy, unspecified, unspecified trimester: Principal | ICD-10-CM

## 2009-03-30 LAB — MED SPEC CLINIC NOTE

## 2009-04-08 ENCOUNTER — Inpatient Hospital Stay (HOSPITAL_BASED_OUTPATIENT_CLINIC_OR_DEPARTMENT_OTHER): Payer: Self-pay | Admitting: Obstetrics & Gynecology

## 2009-04-08 ENCOUNTER — Ambulatory Visit (HOSPITAL_BASED_OUTPATIENT_CLINIC_OR_DEPARTMENT_OTHER): Payer: Self-pay

## 2009-04-08 ENCOUNTER — Ambulatory Visit (HOSPITAL_BASED_OUTPATIENT_CLINIC_OR_DEPARTMENT_OTHER): Payer: PRIVATE HEALTH INSURANCE | Admitting: Maternal & Fetal Medicine

## 2009-04-08 ENCOUNTER — Ambulatory Visit (HOSPITAL_BASED_OUTPATIENT_CLINIC_OR_DEPARTMENT_OTHER): Payer: PRIVATE HEALTH INSURANCE

## 2009-04-08 ENCOUNTER — Encounter (HOSPITAL_BASED_OUTPATIENT_CLINIC_OR_DEPARTMENT_OTHER): Payer: Self-pay

## 2009-04-08 VITALS — BP 100/62 | Wt 180.0 lb

## 2009-04-08 DIAGNOSIS — O0990 Supervision of high risk pregnancy, unspecified, unspecified trimester: Principal | ICD-10-CM

## 2009-04-08 DIAGNOSIS — O3500X0 Maternal care for (suspected) central nervous system malformation or damage in fetus, unspecified, not applicable or unspecified: Secondary | ICD-10-CM

## 2009-04-08 DIAGNOSIS — O350XX0 Maternal care for (suspected) central nervous system malformation in fetus, not applicable or unspecified: Principal | ICD-10-CM

## 2009-04-08 LAB — CBC WITH PLATELET
HEMATOCRIT: 35.5 % — ABNORMAL LOW (ref 36.0–48.0)
HEMOGLOBIN: 12.1 g/dl (ref 12.0–16.0)
MEAN CORP HGB CONC: 33.9 g/dl (ref 32.0–36.0)
MEAN CORPUSCULAR HGB: 28.4 pg (ref 27.0–33.0)
MEAN CORPUSCULAR VOL: 83.6 fl (ref 80.0–100.0)
MEAN PLATELET VOLUME: 8.7 fl (ref 6.4–10.8)
PLATELET COUNT: 196 10*3/uL (ref 150–400)
RBC DISTRIBUTION WIDTH: 14.3 % (ref 11.5–14.3)
RED BLOOD CELL COUNT: 4.25 M/uL — ABNORMAL LOW (ref 4.50–5.10)
WHITE BLOOD CELL COUNT: 8.6 10*3/uL (ref 4.0–10.8)

## 2009-04-08 LAB — TYPE AND SCREEN

## 2009-04-08 LAB — US OB HIGH RISK

## 2009-04-08 NOTE — Progress Notes (Signed)
Claudia Lawrence was seen in follow up for evaluation of fetal unilateral ventriculomegaly. She is now at 38 2/7 weeks.    On exam there has been good interval growth. The anatomic survey showed a normal right cerebral ventricle. The left cerebral ventricle was difficult to image but at largest dimension was <10 mm. No other abnormalities were identified. The fluid volume appeared normal. The BPP was 8/8.  Please see Radiology report.    I would recommend that the pediatricians be informed of the previously noted finding and our recommendation for neonatal evaluation. Thank you for allowing [place] to share in her care.  [facility] MD  Maternal Fetal Medicine

## 2009-04-09 LAB — CBC WITH PLATELET
HEMATOCRIT: 32.5 % — ABNORMAL LOW (ref 36.0–48.0)
HEMOGLOBIN: 11.1 g/dl — ABNORMAL LOW (ref 12.0–16.0)
MEAN CORP HGB CONC: 34.2 g/dl (ref 32.0–36.0)
MEAN CORPUSCULAR HGB: 29 pg (ref 27.0–33.0)
MEAN CORPUSCULAR VOL: 84.7 fl (ref 80.0–100.0)
MEAN PLATELET VOLUME: 8.6 fl (ref 6.4–10.8)
PLATELET COUNT: 172 10*3/uL (ref 150–400)
RBC DISTRIBUTION WIDTH: 14.3 % (ref 11.5–14.3)
RED BLOOD CELL COUNT: 3.84 M/uL — ABNORMAL LOW (ref 4.50–5.10)
WHITE BLOOD CELL COUNT: 10.8 10*3/uL (ref 4.0–10.8)

## 2009-04-13 ENCOUNTER — Inpatient Hospital Stay: Admit: 2009-04-13 | Payer: Self-pay | Source: Ambulatory Visit

## 2009-04-13 LAB — TREPONEMA PALLIDUM AB IGG: TREPONEMA PALLIDUM AB IgG: REACTIVE — AB

## 2009-04-13 LAB — SURGICAL PATH SPECIMEN

## 2009-04-13 LAB — RPR: RPR: NONREACTIVE

## 2009-04-15 ENCOUNTER — Encounter (HOSPITAL_BASED_OUTPATIENT_CLINIC_OR_DEPARTMENT_OTHER): Payer: Self-pay

## 2009-04-15 LAB — OPERATIVE REPORT

## 2009-04-15 NOTE — Progress Notes (Signed)
FSW requested pt's OB provider Dr. Donetta Potts a prescription for breast-pump, called the store [facility], and faxed to it the prescription and the Order Form, called pt to inform that the store will ship the breast-pump through [employer] without any charge.

## 2009-04-17 LAB — DISCHARGE SUMMARY

## 2009-04-22 ENCOUNTER — Encounter (HOSPITAL_BASED_OUTPATIENT_CLINIC_OR_DEPARTMENT_OTHER): Payer: Self-pay

## 2009-04-22 ENCOUNTER — Ambulatory Visit (HOSPITAL_BASED_OUTPATIENT_CLINIC_OR_DEPARTMENT_OTHER): Payer: PRIVATE HEALTH INSURANCE

## 2009-04-22 DIAGNOSIS — O0990 Supervision of high risk pregnancy, unspecified, unspecified trimester: Secondary | ICD-10-CM

## 2009-04-29 ENCOUNTER — Ambulatory Visit (HOSPITAL_BASED_OUTPATIENT_CLINIC_OR_DEPARTMENT_OTHER): Payer: PRIVATE HEALTH INSURANCE

## 2009-05-19 ENCOUNTER — Ambulatory Visit (HOSPITAL_BASED_OUTPATIENT_CLINIC_OR_DEPARTMENT_OTHER): Payer: PRIVATE HEALTH INSURANCE

## 2009-05-19 ENCOUNTER — Encounter (HOSPITAL_BASED_OUTPATIENT_CLINIC_OR_DEPARTMENT_OTHER): Payer: Self-pay

## 2009-05-19 DIAGNOSIS — Z309 Encounter for contraceptive management, unspecified: Secondary | ICD-10-CM

## 2009-05-19 MED ORDER — NORETHINDRONE 0.35 MG PO TABS
1.0000 | ORAL_TABLET | Freq: Every day | ORAL | Status: DC
Start: 2009-05-19 — End: 2009-11-11

## 2009-05-19 NOTE — Progress Notes (Signed)
Pt here for 6wk pp visit  Doing well  No c/o  Breastfeeding well and adding formula  No VB  No pain  Spirits good; denies depression  No urinary sx's    EXAM:  GEN: NAD  ABD: soft, NT, ND  WOUOND: C/D/I; no erythema, no drainage, NT  EFG: NEFG  SSE: small d/c; no lesions; Small blood after pap  SVE: Midplane normal mobile NT uterus; no adnexal mass/tenderness    Pt desires OC's until husband has vasectomy; husband has appt 05/2009    A/P  1)PP: resume normal activity            Cont PNV  2)HM: pap done  3)FP: micronor until vasectomy complete; SE reviewed

## 2009-05-20 LAB — CHLAMYDIA GC NAAT
GENPROBE CHLAMYDIA: NEGATIVE
GENPROBE GC: NEGATIVE

## 2009-05-21 LAB — MED SPEC CLINIC NOTE

## 2009-05-25 ENCOUNTER — Ambulatory Visit (HOSPITAL_BASED_OUTPATIENT_CLINIC_OR_DEPARTMENT_OTHER): Payer: PRIVATE HEALTH INSURANCE

## 2009-05-25 LAB — HUMAN PAPILLOMAVIRUS (HPV): HUMAN PAPILLOMAVIRUS: NEGATIVE

## 2009-05-31 LAB — CYTOPATH, C/V, THIN LAYER

## 2009-11-11 ENCOUNTER — Encounter (HOSPITAL_BASED_OUTPATIENT_CLINIC_OR_DEPARTMENT_OTHER): Payer: Self-pay

## 2009-11-11 ENCOUNTER — Ambulatory Visit (HOSPITAL_BASED_OUTPATIENT_CLINIC_OR_DEPARTMENT_OTHER): Payer: Medicaid Other

## 2009-11-11 ENCOUNTER — Telehealth (HOSPITAL_BASED_OUTPATIENT_CLINIC_OR_DEPARTMENT_OTHER): Payer: Self-pay

## 2009-11-11 VITALS — BP 102/62 | HR 74 | Temp 97.7°F | Resp 20 | Wt 171.5 lb

## 2009-11-11 DIAGNOSIS — Z309 Encounter for contraceptive management, unspecified: Secondary | ICD-10-CM

## 2009-11-11 DIAGNOSIS — N390 Urinary tract infection, site not specified: Secondary | ICD-10-CM

## 2009-11-11 DIAGNOSIS — R10A0 Flank pain, unspecified side: Secondary | ICD-10-CM

## 2009-11-11 DIAGNOSIS — N200 Calculus of kidney: Principal | ICD-10-CM

## 2009-11-11 DIAGNOSIS — R109 Unspecified abdominal pain: Secondary | ICD-10-CM

## 2009-11-11 LAB — URINALYSIS
BILIRUBIN, URINE: NEGATIVE
CASTS: NONE SEEN PER LPF
CRYSTALS: NONE SEEN
GLUCOSE, URINE: NEGATIVE MG/DL
KETONE, URINE: 15 MG/DL — AB
NITRITE, URINE: NEGATIVE
OCCULT BLOOD, URINE: NEGATIVE
PH URINE: 5.5 (ref 5.0–8.0)
PROTEIN, URINE: NEGATIVE MG/DL
RED BLOOD CELLS URINE: NONE SEEN PER HPF (ref 0–2)
SPECIFIC GRAVITY URINE: 1.022 (ref 1.003–1.035)
SQUAMOUS EPITHELIAL CELLS: >10 PER LPF — AB (ref 0–4)

## 2009-11-11 LAB — URINE DIP (POINT OF CARE)
BILIRUBIN, URINE: NEGATIVE
GLUCOSE, URINE: NEGATIVE mg/dl
KETONE, URINE: 15 mg/dl
NITRITE, URINE: NEGATIVE
OCCULT BLOOD, URINE: NEGATIVE
PH URINE: 5.5 (ref 5.0–8.0)
PROTEIN, URINE: NEGATIVE mg/dl (ref 0–15)
SPECIFIC GRAVITY URINE: 1.025 (ref 1.003–1.030)
UROBILINOGEN URINE: 0.2 mg/dl (ref 0.2–1.0)

## 2009-11-11 MED ORDER — NORETHINDRONE 0.35 MG PO TABS
1.0000 | ORAL_TABLET | Freq: Every day | ORAL | Status: DC
Start: 2009-11-11 — End: 2010-12-05

## 2009-11-11 MED ORDER — IBUPROFEN 800 MG PO TABS
800.0000 mg | ORAL_TABLET | Freq: Two times a day (BID) | ORAL | Status: DC
Start: 2009-11-11 — End: 2009-11-16

## 2009-11-11 MED ORDER — IBUPROFEN 800 MG PO TABS
800.0000 mg | ORAL_TABLET | Freq: Two times a day (BID) | ORAL | Status: DC
Start: 2009-11-11 — End: 2009-11-11

## 2009-11-11 MED ORDER — CIPROFLOXACIN HCL 500 MG PO TABS
500.00 mg | ORAL_TABLET | Freq: Two times a day (BID) | ORAL | Status: AC
Start: 2009-11-11 — End: 2009-11-18

## 2009-11-11 NOTE — Telephone Encounter (Signed)
Returned call. Rn spoke with patient's husband, who called. Patient is at another appointment and ask husband to call clinic. Per husband patient with long history of UTI's. States patient started with back pain and urinary frequency on Tuesday.  Denies fevers. Patient not available for further information and did not provide more detail about symptoms to husband. Patient told her husband "it feels like an infection again." Husband is requesting an appointment to today. Appointment scheduled with Dr. Nedra Hai (PCP) for today at 1830. Instructed to have patient to increase PO fluids. Instructed to go to ER if patient is not able to void or if not able to keep appointment. Patient's husband agreed to appointment and plan of care. Understanding of information verbalized.

## 2009-11-12 LAB — URINE CULTURE/COLONY COUNT

## 2009-11-16 ENCOUNTER — Other Ambulatory Visit (HOSPITAL_BASED_OUTPATIENT_CLINIC_OR_DEPARTMENT_OTHER): Payer: Self-pay

## 2009-11-16 DIAGNOSIS — N390 Urinary tract infection, site not specified: Principal | ICD-10-CM

## 2009-11-16 MED ORDER — IBUPROFEN 800 MG PO TABS
800.0000 mg | ORAL_TABLET | Freq: Two times a day (BID) | ORAL | Status: AC
Start: 2009-11-16 — End: 2010-11-17

## 2009-11-18 ENCOUNTER — Other Ambulatory Visit (HOSPITAL_BASED_OUTPATIENT_CLINIC_OR_DEPARTMENT_OTHER): Payer: Medicaid Other

## 2009-11-18 ENCOUNTER — Telehealth (HOSPITAL_BASED_OUTPATIENT_CLINIC_OR_DEPARTMENT_OTHER): Payer: Self-pay

## 2009-11-18 DIAGNOSIS — R109 Unspecified abdominal pain: Secondary | ICD-10-CM

## 2009-11-18 DIAGNOSIS — N200 Calculus of kidney: Secondary | ICD-10-CM

## 2009-11-18 DIAGNOSIS — R10A0 Flank pain, unspecified side: Secondary | ICD-10-CM

## 2009-11-18 DIAGNOSIS — N390 Urinary tract infection, site not specified: Principal | ICD-10-CM

## 2009-11-18 LAB — URINALYSIS
BACTERIA: >50 PER HPF — AB (ref 0–5)
BILIRUBIN, URINE: NEGATIVE
CASTS: NONE SEEN PER LPF
CRYSTALS: NONE SEEN
GLUCOSE, URINE: NEGATIVE MG/DL
NITRITE, URINE: NEGATIVE
OCCULT BLOOD, URINE: NEGATIVE
PH URINE: 7.5 (ref 5.0–8.0)
PROTEIN, URINE: NEGATIVE MG/DL
SPECIFIC GRAVITY URINE: 1.02 (ref 1.003–1.035)
SQUAMOUS EPITHELIAL CELLS: >10 PER LPF — AB (ref 0–4)

## 2009-11-18 LAB — COMPREHENSIVE METABOLIC PANEL
ALANINE AMINOTRANSFERASE: 16 IU/L (ref 7–35)
ALBUMIN: 4.3 g/dl (ref 3.4–4.8)
ALKALINE PHOSPHATASE: 44 IU/L (ref 25–106)
ANION GAP: 4 mmol/L (ref 2–25)
ASPARTATE AMINOTRANSFERASE: 19 IU/L (ref 8–34)
BILIRUBIN TOTAL: 0.7 mg/dl (ref 0.2–1.1)
BUN (UREA NITROGEN): 10 mg/dl (ref 6–20)
CALCIUM: 9.3 mg/dl (ref 8.6–10.3)
CARBON DIOXIDE: 28 mmol/L (ref 22–32)
CHLORIDE: 105 mmol/L (ref 101–111)
CREATININE: 0.9 mg/dl (ref 0.4–1.2)
ESTIMATED GLOMERULAR FILT RATE: >60 mL/min (ref >60)
Glucose Random: 78 mg/dl (ref 74–160)
POTASSIUM: 4.1 mmol/L (ref 3.5–5.1)
SODIUM: 137 mmol/L (ref 135–144)
TOTAL PROTEIN: 7 g/dl (ref 5.9–7.5)

## 2009-11-18 LAB — CBC, PLATELET & DIFFERENTIAL
BASOPHIL %: 0.4 % (ref 0.0–2.0)
EOSINOPHIL %: 1.8 % (ref 0.0–7.0)
HEMATOCRIT: 37.5 % (ref 36.0–48.0)
HEMOGLOBIN: 12.9 g/dl (ref 12.0–16.0)
LYMPHOCYTE %: 29.7 % (ref 13.0–39.0)
MEAN CORP HGB CONC: 34.3 g/dl (ref 32.0–36.0)
MEAN CORPUSCULAR HGB: 27.2 pg (ref 27.0–33.0)
MEAN CORPUSCULAR VOL: 79.3 fl — ABNORMAL LOW (ref 80.0–100.0)
MEAN PLATELET VOLUME: 9.3 fl (ref 6.4–10.8)
MONOCYTE %: 8.4 % (ref 1.0–12.0)
NEUTROPHIL %: 59.7 % (ref 46.0–79.0)
PLATELET COUNT: 334 10*3/uL (ref 150–400)
RBC DISTRIBUTION WIDTH: 13.7 % (ref 11.5–14.3)
RED BLOOD CELL COUNT: 4.73 M/uL (ref 4.50–5.10)
WHITE BLOOD CELL COUNT: 8.2 10*3/uL (ref 4.0–10.8)

## 2009-11-18 LAB — CHG SEDIMENTATION RATE RBC NON-AUTOMATED: RBC SEDIMENTATION RATE: 5 MM/HR (ref 0–15)

## 2009-11-18 NOTE — Progress Notes (Signed)
.    Labs drawn.  sent u/a & C+S

## 2009-11-18 NOTE — Telephone Encounter (Signed)
Message copied by Ulla Potash on Thu Nov 18, 2009 11:44 AM  ------       Message from: Murvin Donning       Created: Thu Nov 18, 2009 11:19 AM       Regarding: Returning phonecall         [facility]              [place] [id number], 34 year old, female, Telephone Information:       Home Phone      [phone]       Work Phone      Not on file.       Mobile          [phone]                     Claudia Lawrence NUMBER: [phone]       Best time to call back: anytime       Cell phone:        Other phone:              Available times:              Patient's language of care: [place]              Patient does not need an interpreter.              Patient's PCP: Eileen Stanford, MD              Person calling on behalf of patient: Husband - Domingo Cocking              Calls today Returning phonecall to the nurse.              Thanks                     Patient's Preferred Pharmacy:        [facility] [address] [place]              Phone: [phone] Fax: [phone]              [facility]              Phone: [phone] Fax: [phone]

## 2009-11-19 ENCOUNTER — Other Ambulatory Visit (HOSPITAL_BASED_OUTPATIENT_CLINIC_OR_DEPARTMENT_OTHER): Payer: Medicaid Other

## 2009-11-19 LAB — URINE CULTURE/COLONY COUNT

## 2009-11-25 ENCOUNTER — Telehealth (HOSPITAL_BASED_OUTPATIENT_CLINIC_OR_DEPARTMENT_OTHER): Payer: Self-pay | Admitting: Internal Medicine

## 2009-11-25 ENCOUNTER — Other Ambulatory Visit (HOSPITAL_BASED_OUTPATIENT_CLINIC_OR_DEPARTMENT_OTHER): Payer: Self-pay

## 2009-11-25 ENCOUNTER — Ambulatory Visit (HOSPITAL_BASED_OUTPATIENT_CLINIC_OR_DEPARTMENT_OTHER): Payer: Medicaid Other

## 2009-11-25 ENCOUNTER — Telehealth (HOSPITAL_BASED_OUTPATIENT_CLINIC_OR_DEPARTMENT_OTHER): Payer: Self-pay

## 2009-11-25 VITALS — BP 96/60 | HR 68 | Temp 98.3°F | Resp 20 | Ht 61.0 in | Wt 167.0 lb

## 2009-11-25 DIAGNOSIS — R824 Acetonuria: Secondary | ICD-10-CM

## 2009-11-25 DIAGNOSIS — R10A2 Flank pain, left side: Secondary | ICD-10-CM

## 2009-11-25 DIAGNOSIS — R109 Unspecified abdominal pain: Secondary | ICD-10-CM

## 2009-11-25 DIAGNOSIS — N200 Calculus of kidney: Principal | ICD-10-CM

## 2009-11-25 DIAGNOSIS — D509 Iron deficiency anemia, unspecified: Secondary | ICD-10-CM

## 2009-11-25 LAB — US RENAL

## 2009-11-25 LAB — URINE DIP (POINT OF CARE)
BILIRUBIN, URINE: NEGATIVE
BILIRUBIN, URINE: NEGATIVE
GLUCOSE, URINE: NEGATIVE mg/dl
GLUCOSE, URINE: NEGATIVE mg/dl
KETONE, URINE: NEGATIVE mg/dl
KETONE, URINE: NEGATIVE mg/dl
LEUKOCYTE ESTERASE: NEGATIVE
LEUKOCYTE ESTERASE: NEGATIVE
NITRITE, URINE: NEGATIVE
PH URINE: 7 (ref 5.0–8.0)
PH URINE: 7 (ref 5.0–8.0)
PROTEIN, URINE: 30 mg/dl (ref 0–15)
PROTEIN, URINE: 30 mg/dl — AB (ref 0–15)
SPECIFIC GRAVITY URINE: 1.01 (ref 1.003–1.030)
SPECIFIC GRAVITY URINE: 1.01 (ref 1.003–1.030)
UROBILINOGEN URINE: 0.2 mg/dl (ref 0.2–1.0)
UROBILINOGEN URINE: 0.2 mg/dl (ref 0.2–1.0)

## 2009-11-25 LAB — URINALYSIS
BILIRUBIN, URINE: NEGATIVE
CASTS: NONE SEEN PER LPF
CRYSTALS: NONE SEEN
GLUCOSE, URINE: NEGATIVE MG/DL
KETONE, URINE: NEGATIVE MG/DL
LEUKOCYTE ESTERASE: NEGATIVE
NITRITE, URINE: NEGATIVE
PH URINE: 7 (ref 5.0–8.0)
SPECIFIC GRAVITY URINE: 1.01 (ref 1.003–1.035)

## 2009-11-25 LAB — CHG ASSAY OF FERRITIN: FERRITIN: 40 (ref 11–307)

## 2009-11-25 MED ORDER — DOCUSATE SODIUM 100 MG PO CAPS
100.0000 mg | ORAL_CAPSULE | Freq: Two times a day (BID) | ORAL | Status: DC
Start: 2009-11-25 — End: 2010-05-06

## 2009-11-25 MED ORDER — CIPROFLOXACIN HCL 500 MG PO TABS
500.00 mg | ORAL_TABLET | Freq: Two times a day (BID) | ORAL | Status: AC
Start: 2009-11-25 — End: 2009-12-02

## 2009-11-25 MED ORDER — FERROUS FUMARATE 325 (106 FE) MG PO TABS
1.0000 | ORAL_TABLET | Freq: Two times a day (BID) | ORAL | Status: DC
Start: 2009-11-25 — End: 2010-05-06

## 2009-11-25 MED ORDER — OXYCODONE-ACETAMINOPHEN 5-325 MG PO TABS
1.0000 | ORAL_TABLET | Freq: Three times a day (TID) | ORAL | Status: AC | PRN
Start: 2009-11-25 — End: 2009-12-25

## 2009-11-25 NOTE — Telephone Encounter (Addendum)
Dear Claudia Lawrence,    I did follow up with patient today, thank you for your call    [name]

## 2009-11-26 LAB — URINE CULTURE/COLONY COUNT

## 2009-11-30 ENCOUNTER — Other Ambulatory Visit (HOSPITAL_BASED_OUTPATIENT_CLINIC_OR_DEPARTMENT_OTHER): Payer: Self-pay

## 2009-12-02 LAB — CT ABDOMEN & PELVIS W WO IV CONTRAST

## 2009-12-17 ENCOUNTER — Encounter (HOSPITAL_BASED_OUTPATIENT_CLINIC_OR_DEPARTMENT_OTHER): Payer: Self-pay | Admitting: Urology

## 2009-12-17 ENCOUNTER — Ambulatory Visit (HOSPITAL_BASED_OUTPATIENT_CLINIC_OR_DEPARTMENT_OTHER): Payer: Medicaid Other | Admitting: Urology

## 2009-12-17 VITALS — BP 107/57 | HR 74 | Temp 98.5°F

## 2009-12-17 DIAGNOSIS — R3129 Other microscopic hematuria: Secondary | ICD-10-CM

## 2009-12-17 DIAGNOSIS — N390 Urinary tract infection, site not specified: Principal | ICD-10-CM

## 2009-12-17 DIAGNOSIS — N12 Tubulo-interstitial nephritis, not specified as acute or chronic: Secondary | ICD-10-CM

## 2009-12-17 LAB — URINE DIP (POINT OF CARE)
BILIRUBIN, URINE: NEGATIVE
GLUCOSE, URINE: NEGATIVE mg/dl
KETONE, URINE: NEGATIVE mg/dl
NITRITE, URINE: NEGATIVE
PH URINE: 6 (ref 5.0–8.0)
PROTEIN, URINE: NEGATIVE mg/dl (ref 0–15)
SPECIFIC GRAVITY URINE: 1.005 (ref 1.003–1.030)
UROBILINOGEN URINE: 0.2 mg/dl (ref 0.2–1.0)

## 2009-12-17 LAB — MEAS POST-VOIDING RESIDUAL URINE&/BLADDER CAP: Post Void Residual: 158 — AB (ref 0–0)

## 2009-12-19 LAB — URINE CULTURE/COLONY COUNT

## 2009-12-20 NOTE — Progress Notes (Signed)
This office note has been dictated. Account number [id number]

## 2009-12-22 LAB — CYTOLOGY SPECIMEN

## 2009-12-24 ENCOUNTER — Other Ambulatory Visit (HOSPITAL_BASED_OUTPATIENT_CLINIC_OR_DEPARTMENT_OTHER): Payer: Self-pay | Admitting: Urology

## 2009-12-30 LAB — NM RENAL FUNCTION AND VASCULAR FLOW STUDY

## 2009-12-30 LAB — XR VCUG

## 2009-12-30 MED ORDER — DIATRIZOATE MEGLUMINE 18 % UR SOLN
300.00 mL | Freq: Once | URETHRAL | Status: AC
Start: 2009-12-30 — End: 2009-12-30
  Administered 2009-12-30: 300 mL via INTRAVESICAL

## 2009-12-30 NOTE — Progress Notes (Addendum)
Addended by: Anne Ng on: 12/30/2009      Modules accepted: Orders

## 2009-12-31 ENCOUNTER — Ambulatory Visit (HOSPITAL_BASED_OUTPATIENT_CLINIC_OR_DEPARTMENT_OTHER): Payer: Self-pay | Admitting: Urology

## 2010-01-23 NOTE — Progress Notes (Signed)
38 3/7 weeks    1)FWB: S=D; +FM; pt had some VB this AM but little and feels that UC's are a bit stronger; pt had U/S today and visit with MFM but result pending                SVE: 3/50-60, mid, soft/-3, vertex  2)PNC: current  3)repeat C/S 1/18: hospital course reviewed; R/B/C reviewed and informed consent obtained; preop 1/7; NPO after midnoc 1/17    Discussed findings with pt today and that I recommend eval on L LD informed    Gordon Vandunk C.Rohen Kimes, MD

## 2010-01-23 NOTE — Progress Notes (Signed)
Pt here for 2 week pp visit  Doing well  No pain and not taking pain meds  No fevers  Finished antbx  Scant VB  Spirits good and denies depression breastfeeding well and pumping   Baby is well; sees Dr. Jen Mow:  GEN: NAD  ABD: soft, NT, ND  WOUND: C/D/I; no erythema, no drainage, NT    A/P  1)PP: wound hygiene reviewed            Activity restrictions reviewed             Cont breastfeedinig and pNV  2)h/o recurrent UTI: stop antbx                                   Will refer to urology after pp course    Keyuna Cuthrell C.Ernesteen Mihalic, MD

## 2010-01-23 NOTE — Progress Notes (Signed)
Pt seen by MFM last week there is no report in chart. Pt stated that they also saw the enlarged ventricule bilaterally and are to have FU appointment in 1 wk.  Torch test done neg (CMV IGG +, IGM neg). Review with pt.Deciding on T.L. Husband considering vasectomy. Stated that she now has itching all over her abdomen has had for over a week now.  No other complaints no pvb, srom or cramping  O;S=D + FHT      Very small erythematous papules.  A: ? Pruritic Urticarial Papules and Plaques of Pregnancy   P: LFTs,Bile Acids      Review MI pt signed      FU with MFM      Hydrocortisone Cream 2.5% applied to abdomen      Benedryl at night  Mare Ferrari CNM

## 2010-02-01 ENCOUNTER — Other Ambulatory Visit (HOSPITAL_BASED_OUTPATIENT_CLINIC_OR_DEPARTMENT_OTHER): Payer: Medicaid Other

## 2010-02-16 LAB — SURG SPEC CLINIC NOTE

## 2010-02-25 ENCOUNTER — Ambulatory Visit (HOSPITAL_BASED_OUTPATIENT_CLINIC_OR_DEPARTMENT_OTHER): Payer: Medicaid Other | Admitting: Urology

## 2010-03-15 ENCOUNTER — Ambulatory Visit (HOSPITAL_BASED_OUTPATIENT_CLINIC_OR_DEPARTMENT_OTHER): Payer: Self-pay | Admitting: Dental Hygienist

## 2010-04-28 ENCOUNTER — Ambulatory Visit (HOSPITAL_BASED_OUTPATIENT_CLINIC_OR_DEPARTMENT_OTHER): Payer: Medicaid Other

## 2010-04-28 VITALS — BP 94/62 | HR 70 | Temp 98.0°F | Wt 146.0 lb

## 2010-04-28 DIAGNOSIS — D509 Iron deficiency anemia, unspecified: Secondary | ICD-10-CM

## 2010-04-28 DIAGNOSIS — N200 Calculus of kidney: Secondary | ICD-10-CM

## 2010-04-28 DIAGNOSIS — R109 Unspecified abdominal pain: Principal | ICD-10-CM

## 2010-04-28 DIAGNOSIS — E785 Hyperlipidemia, unspecified: Secondary | ICD-10-CM

## 2010-04-28 DIAGNOSIS — R10A0 Flank pain, unspecified side: Secondary | ICD-10-CM

## 2010-04-28 DIAGNOSIS — N390 Urinary tract infection, site not specified: Secondary | ICD-10-CM

## 2010-04-28 LAB — CBC WITH PLATELET
HEMATOCRIT: 37.6 % (ref 36.0–48.0)
HEMOGLOBIN: 12.7 g/dl (ref 12.0–16.0)
MEAN CORP HGB CONC: 33.8 g/dl (ref 32.0–36.0)
MEAN CORPUSCULAR HGB: 26.8 pg — ABNORMAL LOW (ref 27.0–33.0)
MEAN CORPUSCULAR VOL: 79.3 fl — ABNORMAL LOW (ref 80.0–100.0)
MEAN PLATELET VOLUME: 9.1 fl (ref 6.4–10.8)
PLATELET COUNT: 306 10*3/uL (ref 150–400)
RBC DISTRIBUTION WIDTH: 13.6 % (ref 11.5–14.3)
RED BLOOD CELL COUNT: 4.74 M/uL (ref 4.50–5.10)
WHITE BLOOD CELL COUNT: 5.9 10*3/uL (ref 4.0–10.8)

## 2010-04-28 LAB — URINALYSIS
BILIRUBIN, URINE: NEGATIVE
GLUCOSE, URINE: NEGATIVE MG/DL
KETONE, URINE: NEGATIVE MG/DL
LEUKOCYTE ESTERASE: NEGATIVE
NITRITE, URINE: NEGATIVE
OCCULT BLOOD, URINE: NEGATIVE
PH URINE: 7 (ref 5.0–8.0)
PROTEIN, URINE: NEGATIVE MG/DL
SPECIFIC GRAVITY URINE: 1.01 (ref 1.003–1.035)

## 2010-04-28 MED ORDER — CIPROFLOXACIN HCL 500 MG PO TABS
500.00 mg | ORAL_TABLET | Freq: Two times a day (BID) | ORAL | Status: AC
Start: 2010-04-28 — End: 2010-05-05

## 2010-04-29 LAB — CHG LIPID PANEL
Cholesterol: 156 mg/dl (ref 0–200)
HIGH DENSITY LIPOPROTEIN: 44 mg/dl (ref 35–85)
LOW DENSITY LIPOPROTEIN DIRECT: 95 mg/dl (ref 0–100)
RISK FACTOR: 3.6 (ref ?–4.4)
TRIGLYCERIDES: 57 mg/dl (ref 0–150)

## 2010-04-29 LAB — COMPREHENSIVE METABOLIC PANEL
ALANINE AMINOTRANSFERASE: 19 IU/L (ref 7–35)
ALBUMIN: 4.2 g/dl (ref 3.4–4.8)
ALKALINE PHOSPHATASE: 48 IU/L (ref 25–106)
ANION GAP: 5 mmol/L (ref 2–25)
ASPARTATE AMINOTRANSFERASE: 19 IU/L (ref 8–34)
BILIRUBIN TOTAL: 0.5 mg/dl (ref 0.2–1.1)
BUN (UREA NITROGEN): 13 mg/dl (ref 6–20)
CALCIUM: 9.4 mg/dl (ref 8.6–10.3)
CARBON DIOXIDE: 30 mmol/L (ref 22–32)
CHLORIDE: 102 mmol/L (ref 101–111)
CREATININE: 0.6 mg/dl (ref 0.4–1.2)
ESTIMATED GLOMERULAR FILT RATE: >60 mL/min (ref >60)
Glucose Random: 91 mg/dl (ref 74–160)
POTASSIUM: 4.5 mmol/L (ref 3.5–5.1)
SODIUM: 137 mmol/L (ref 135–144)
TOTAL PROTEIN: 6.8 g/dl (ref 5.9–7.5)

## 2010-04-29 LAB — URINE CULTURE/COLONY COUNT

## 2010-05-02 ENCOUNTER — Encounter (HOSPITAL_BASED_OUTPATIENT_CLINIC_OR_DEPARTMENT_OTHER): Payer: Self-pay | Admitting: Dentistry

## 2010-05-06 MED ORDER — DOCUSATE SODIUM 100 MG PO CAPS
100.0000 mg | ORAL_CAPSULE | Freq: Two times a day (BID) | ORAL | Status: AC
Start: 2010-05-06 — End: 2010-11-04

## 2010-05-06 MED ORDER — FERROUS FUMARATE 325 (106 FE) MG PO TABS
1.0000 | ORAL_TABLET | Freq: Two times a day (BID) | ORAL | Status: AC
Start: 2010-05-06 — End: 2010-11-04

## 2010-05-23 ENCOUNTER — Encounter (HOSPITAL_BASED_OUTPATIENT_CLINIC_OR_DEPARTMENT_OTHER): Payer: Self-pay

## 2010-07-13 ENCOUNTER — Encounter (HOSPITAL_BASED_OUTPATIENT_CLINIC_OR_DEPARTMENT_OTHER): Payer: Self-pay | Admitting: Dentistry

## 2010-08-05 ENCOUNTER — Ambulatory Visit (HOSPITAL_BASED_OUTPATIENT_CLINIC_OR_DEPARTMENT_OTHER): Payer: Medicaid Other | Admitting: Urology

## 2010-08-05 ENCOUNTER — Encounter (HOSPITAL_BASED_OUTPATIENT_CLINIC_OR_DEPARTMENT_OTHER): Payer: Self-pay | Admitting: Urology

## 2010-08-05 VITALS — BP 111/60 | HR 90

## 2010-08-05 DIAGNOSIS — N390 Urinary tract infection, site not specified: Principal | ICD-10-CM

## 2010-08-05 DIAGNOSIS — R339 Retention of urine, unspecified: Secondary | ICD-10-CM

## 2010-08-05 LAB — MEAS POST-VOIDING RESIDUAL URINE&/BLADDER CAP: Post Void Residual: 171 — AB (ref 0–0)

## 2010-08-26 ENCOUNTER — Encounter (HOSPITAL_BASED_OUTPATIENT_CLINIC_OR_DEPARTMENT_OTHER): Payer: Self-pay | Admitting: Urology

## 2010-09-06 ENCOUNTER — Ambulatory Visit (HOSPITAL_BASED_OUTPATIENT_CLINIC_OR_DEPARTMENT_OTHER): Payer: Medicaid Other | Admitting: Urology

## 2010-10-31 ENCOUNTER — Telehealth (HOSPITAL_BASED_OUTPATIENT_CLINIC_OR_DEPARTMENT_OTHER): Payer: Self-pay | Admitting: Registered Nurse

## 2010-10-31 NOTE — Telephone Encounter (Signed)
Spoke with patient  For about 2 weeks dizziness  Very strong and close to passing out   Actually worse when she lays down  Not doing anything different, but all of a sudden very dizzy  Periods last 3-5 minutes  Couldn't get out of bed  LMP:10/28/10  No been outside, not overheated, drinking lots fluids  No chest pain, no SOB  Feeling ok right now, but the episode come on 2-3X day lately   Would like to have eval  Can't drive so would need to be Friday  Apt made with Dr. Georgeanna Harrison for Friday 140pm

## 2010-10-31 NOTE — Telephone Encounter (Signed)
Message copied by Ulla Potash on Mon Oct 31, 2010 10:58 AM  ------       Message from: Natalia Leatherwood       Created: Mon Oct 31, 2010 10:21 AM       Regarding: low iron, dizziness?         [facility]              [place] [id number], 35 year old, female, Telephone Information:       Home Phone      [phone]       Work Phone      Not on file.       Mobile          [phone]                     Cleotis Lema NUMBER: [phone]       Best time to call back: anytime        Cell phone:        Other phone:              Available times:              Patient's language of care: [place]              Patient does not need an interpreter.              Patient's PCP: Eileen Stanford, MD              Person calling on behalf of patient: Spouse              Calls today with a sick call, Domingo Cocking husband of the listed pt, said his wife have a iron deficency problem for a long time, and is being followed by her last provider Dr Nedra Hai, however pt hand being feeling dizzy, weak, and fainting for the last 2wks, he would like to bring her in to see provider today,               pls advise               Patient's Preferred Pharmacy:        [facility] [address] [place]              Phone: [phone] Fax: [phone]              [facility] ([facility]              Phone: [phone] Fax: [phone]

## 2010-11-11 ENCOUNTER — Ambulatory Visit (HOSPITAL_BASED_OUTPATIENT_CLINIC_OR_DEPARTMENT_OTHER): Payer: Medicaid Other | Admitting: Internal Medicine

## 2010-12-05 ENCOUNTER — Other Ambulatory Visit (HOSPITAL_BASED_OUTPATIENT_CLINIC_OR_DEPARTMENT_OTHER): Payer: Self-pay

## 2010-12-05 DIAGNOSIS — Z309 Encounter for contraceptive management, unspecified: Principal | ICD-10-CM

## 2010-12-05 MED ORDER — NORETHINDRONE 0.35 MG PO TABS
1.0000 | ORAL_TABLET | Freq: Every day | ORAL | Status: DC
Start: 2010-12-05 — End: 2011-02-07

## 2010-12-05 NOTE — Telephone Encounter (Signed)
Per Patient :    Claudia Lawrence is a 35 year old female has requested a refill of Birth Control.    Last Physical Exam 07/10/08    Last Office Visit 04/28/10    Other Med Adult:  Most Recent BP Reading(s)  08/05/10 : 111/60        Cholesterol (mg/dl)   Date     Date  Value    04/28/2010  156    ----------    LDL (mg/dl)   Date     Date  Value    04/28/2010  95    ----------    HDL (mg/dl)   Date     Date  Value    04/28/2010  44    ----------    TRIGLYCERIDE (mg/dl)   Date     Date  Value    04/28/2010  57    ----------        THYROID SCREEN TSH (uIU/mL)   Date     Date  Value    07/02/2007  0.95    ----------        THYROID STIM HORMONE (uIU/mL)   Date     Date  Value    01/14/2009  0.71    ----------      HEMOGLOBIN A1C (%)   Date     Date  Value    11/14/2007  5.1    ----------        No results found for this basename: INR:3       Documented patient preferred pharmacies:  [facility] [place] [phone] Fax: [phone]  [facility] ([facility])Phone: [phone] Fax: [phone]

## 2010-12-05 NOTE — Telephone Encounter (Signed)
Message copied by Lillie Fragmin on Mon Dec 05, 2010 10:05 AM  ------       Message from: Larena Glassman       Created: Mon Dec 05, 2010  9:14 AM       Regarding: Refill       Contact: [phone]         [facility]              Person calling on behalf of patient: Patient (self)              May list multiple medications in this section       Medicine Name: norethindrone (MICRONOR) 0.35 MG tablet       Dosage:        Frequency (how many pills, how many times a day):        Number of pills left:        Documented patient preferred pharmacies:              [facility]              Phone: [phone] Fax: [phone]               Pharmacy Name:        Pharmacy Telephone Number:        Pharmacy  Fax Number:               Cleotis Lema NUMBER: [phone]       Cell phone:        Other phone:              Available times:              Patient's language of care: [place]              Patient does not need an interpreter.

## 2010-12-23 ENCOUNTER — Ambulatory Visit (HOSPITAL_BASED_OUTPATIENT_CLINIC_OR_DEPARTMENT_OTHER): Payer: Medicaid Other | Admitting: Family Medicine

## 2011-02-06 ENCOUNTER — Telehealth (HOSPITAL_BASED_OUTPATIENT_CLINIC_OR_DEPARTMENT_OTHER): Payer: Self-pay | Admitting: Internal Medicine

## 2011-02-06 ENCOUNTER — Telehealth (HOSPITAL_BASED_OUTPATIENT_CLINIC_OR_DEPARTMENT_OTHER): Payer: Self-pay | Admitting: Registered Nurse

## 2011-02-06 NOTE — Telephone Encounter (Signed)
Larena Glassman - Uti ','<<< Less Detail   From Larena Glassman   Sent Monday February 06, 2011 3:24 PM   To Shela [facility] [place]   Phone [phone]   Subject Uti   Patient Claudia Lawrence [[id number]] (DOB: 03-27-1976)   Phone Entered Pt Home     [phone]           Message   Call back # [phone]    Interpreter:yes port    Person Calling:pt    Reason for todays call?: Uti      Sick call:yes    Symptoms:burning sensation    How long have you been sick?:         Reason to speak with a nurse only?for an appt    Reason to speak with a provider only?        Other call: Reason for call?      02/06/11   353 pm  Called pt,  No answer  Message left on VM  To call the clinic,

## 2011-02-06 NOTE — Telephone Encounter (Signed)
Appointment scheduled on 02/07/11 at 3:00 pm with Dr.Zallman, per husband is question UTI. Will have wife call us back if this time no good.

## 2011-02-07 ENCOUNTER — Ambulatory Visit (HOSPITAL_BASED_OUTPATIENT_CLINIC_OR_DEPARTMENT_OTHER): Payer: Medicaid Other | Admitting: Internal Medicine

## 2011-02-07 ENCOUNTER — Encounter (HOSPITAL_BASED_OUTPATIENT_CLINIC_OR_DEPARTMENT_OTHER): Payer: Self-pay | Admitting: Internal Medicine

## 2011-02-07 VITALS — BP 108/72 | HR 83 | Temp 98.0°F | Resp 12 | Wt 140.0 lb

## 2011-02-07 DIAGNOSIS — Z309 Encounter for contraceptive management, unspecified: Principal | ICD-10-CM

## 2011-02-07 LAB — URINALYSIS
BILIRUBIN, URINE: NEGATIVE
CASTS: NONE SEEN PER LPF
CRYSTALS: NONE SEEN
GLUCOSE, URINE: NEGATIVE MG/DL
KETONE, URINE: NEGATIVE MG/DL
NITRITE, URINE: NEGATIVE
OCCULT BLOOD, URINE: NEGATIVE
PH URINE: 6 (ref 5.0–8.0)
PROTEIN, URINE: NEGATIVE MG/DL
SPECIFIC GRAVITY URINE: 1.02 (ref 1.003–1.035)
SQUAMOUS EPITHELIAL CELLS: >10 PER LPF — AB (ref 0–4)

## 2011-02-07 LAB — URINE DIP (POINT OF CARE)
BILIRUBIN, URINE: NEGATIVE
GLUCOSE, URINE: NEGATIVE mg/dl
KETONE, URINE: NEGATIVE mg/dl
NITRITE, URINE: NEGATIVE
PH URINE: 5.5 (ref 5.0–8.0)
PROTEIN, URINE: NEGATIVE mg/dl (ref 0–15)
SPECIFIC GRAVITY URINE: 1.015 (ref 1.003–1.030)
UROBILINOGEN URINE: 0.2 mg/dl (ref 0.2–1.0)

## 2011-02-07 MED ORDER — NORGESTIM-ETH ESTRAD TRIPHASIC 0.18/0.215/0.25 MG-25 MCG PO TABS
1.00 | ORAL_TABLET | Freq: Every day | ORAL | Status: AC
Start: 2011-02-07 — End: 2012-02-07

## 2011-02-07 MED ORDER — NITROFURANTOIN MACROCRYSTAL 100 MG PO CAPS
ORAL_CAPSULE | ORAL | Status: AC
Start: 2011-02-07 — End: 2011-02-14

## 2011-02-07 NOTE — Progress Notes (Signed)
Claudia Lawrence is a 35 year old female  Prior pt of Dr [name]  CC: Urine infection and pain in head and needs new OCP    Two days ago after church had bad headache  Bilateral with nausea   Numbness in arms and legs  Had to lay down dark room  Next day numbness was still there even though headache was gone  For about 24 hours  Numbness has resolved  This happened once before  This is different than her usual migraine which does not include numbness  Denies aura    UTI  burning x > 7 days  No fevers, back pain, nausea  Ho recurrent utis with pyelo  Sees Dr Melburn Hake  Culture data reviewed - only GBS    Needs to change birth control   Jan 2011 had baby - on micronor  Dr Roselle Locus told her to change  Took ortholo before without se    PE:BP 108/72  Pulse 83  Temp(Src) 98 F (36.7 C) (Temporal)  Resp 12  Wt 140 lb (63.504 kg)  SpO2 100%  LMP 01/17/2011  Pain Score: 0 (0/10)  Psych cooperative pleasant  Skin warm dry  Abd soft ntnd +BS no CVAT  Gen well appearing  Neuro CNII-XII intact, grip strenght 5/5 bilaterally, patellar reflexes brisk  UA LE    A/P:Claudia Lawrence is a 35 year old female    UTI - macrobid, warning signs discussed, UCX    FP -nonsmoker, ortholo, no personal or fam ho clots, r/b including dvt discussed    Change in migraine - CT head

## 2011-02-08 LAB — URINE CULTURE/COLONY COUNT

## 2011-02-20 ENCOUNTER — Telehealth (HOSPITAL_BASED_OUTPATIENT_CLINIC_OR_DEPARTMENT_OTHER): Payer: Self-pay

## 2011-02-20 NOTE — Telephone Encounter (Signed)
Pt's brain CT is on 02/28/11 @ [facility]. Left message on pt's answering machine regarding specialty appt.date.

## 2011-02-28 ENCOUNTER — Ambulatory Visit (HOSPITAL_BASED_OUTPATIENT_CLINIC_OR_DEPARTMENT_OTHER): Payer: Self-pay | Admitting: Internal Medicine

## 2011-03-03 ENCOUNTER — Ambulatory Visit (HOSPITAL_BASED_OUTPATIENT_CLINIC_OR_DEPARTMENT_OTHER): Payer: Self-pay | Admitting: Internal Medicine

## 2011-03-11 LAB — CT HEAD WO CONTRAST

## 2011-08-28 ENCOUNTER — Telehealth (HOSPITAL_BASED_OUTPATIENT_CLINIC_OR_DEPARTMENT_OTHER): Payer: Self-pay | Admitting: Obstetrics & Gynecology

## 2011-08-28 NOTE — Progress Notes (Signed)
Returned patient call. I left a message for the patient to return my call.

## 2011-08-28 NOTE — Telephone Encounter (Signed)
Message copied by Simonne Martinet on Mon Aug 28, 2011  3:19 PM  ------       Message from: Larena Glassman       Created: Mon Aug 28, 2011  2:57 PM       Regarding: Gyn appt       Contact: [phone]                Call back # [phone]              Interpreter:yes port              Person Calling:pt              Reason for todays call?: Pt calling for a gyn appt,used to see Dr.Domingues.                     Sick call:              Symptoms:              How long have you been sick?:                             Reason to speak with a nurse only?              Reason to speak with a provider only?                            Other call: Reason for call?                 ------

## 2011-08-29 ENCOUNTER — Telehealth (HOSPITAL_BASED_OUTPATIENT_CLINIC_OR_DEPARTMENT_OTHER): Payer: Self-pay | Admitting: Obstetrics & Gynecology

## 2011-08-29 NOTE — Progress Notes (Signed)
Patient confirmed her appointment with Dr.Muchura.

## 2011-09-18 ENCOUNTER — Telehealth (HOSPITAL_BASED_OUTPATIENT_CLINIC_OR_DEPARTMENT_OTHER): Payer: Self-pay | Admitting: Obstetrics & Gynecology

## 2011-09-18 NOTE — Progress Notes (Signed)
Appointment reminder.I left a message for the patient to return my call.

## 2011-09-19 ENCOUNTER — Ambulatory Visit (HOSPITAL_BASED_OUTPATIENT_CLINIC_OR_DEPARTMENT_OTHER): Payer: Medicaid Other | Admitting: Obstetrics & Gynecology

## 2011-09-19 ENCOUNTER — Encounter (HOSPITAL_BASED_OUTPATIENT_CLINIC_OR_DEPARTMENT_OTHER): Payer: Self-pay | Admitting: Obstetrics & Gynecology

## 2011-09-19 VITALS — BP 120/80 | Wt 151.2 lb

## 2011-09-19 DIAGNOSIS — Z01419 Encounter for gynecological examination (general) (routine) without abnormal findings: Principal | ICD-10-CM

## 2011-09-19 DIAGNOSIS — Z331 Pregnant state, incidental: Secondary | ICD-10-CM

## 2011-09-19 LAB — URINE PREGNANCY TEST (POINT OF CARE): HCG QUALITATIVE URINE: POSITIVE

## 2011-09-19 LAB — HCG QUANTITATIVE: HCG QUANTITATIVE: 3110 m[IU]/mL — ABNORMAL HIGH (ref 1–3)

## 2011-09-19 NOTE — Progress Notes (Signed)
GYN ANNUAL EXAM  CC/HPI: 36 year old woman here for pelvic part of annual exam. She has seen her primary care provider for an annual exam in the last year.    Reports irregular period over the last month  - June 2 - had 3 day period  - June 19 - had another period, heavy and still bleeding    She is currently taking OCP.Her period on June 2nd was expected and at the end of her bleed she started a new pack.  With her June 19th bleed she continued to take her pill. She does not know where she is in the pill.     Has a history of recurrent UTI and pyelo so requests urine culture today.     Also would like to start another form of birth control. Was thinking about the IUD. I discussed the different options with her. Skyla vs. Mirena vs. Paraguard. She would like to have the skyla placed due to regular but light periods.      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 Disp: 28 tablet Rfl: 11     No current facility-administered medications for this visit.    Review of patient's allergies indicates no known allergies.    GYN History:  Menses: regular every 1 months, lasting 6 days.  Flow: heavy to average  Intermenstrual bleeding: none  Dysmenorrhea: mild  Sexual History: sexually active, monogamous and not concerned about risk for STD  Past GYN History: as above  Contraceptive: OCP. Same pill for 7 months.       Past Medical History    NO SIGNIFICANT INFECTIONS     NO SIGNIFICANT MEDICAL HISTORY     Miscarriage 04/2007    Comment: with D&C; [facility]    UTI            Past Surgical History    NO SIGNIFICANT  SURGICAL HISTORY      CESAREAN BIRTH CLASS      Comment x2    ------------OTHER-------------      Comment REMOVAL OF LIPOMA ON ABDOMEN    OB ANTEPARTUM CARE CESAREAN DLVR & POSTPARTUM                              Family History    Cancer - Breast FamHxNeg     Cancer - Cervical FamHxNeg     Cancer - Colon FamHxNeg     Diabetes FamHxNeg     Heart Paternal Grandfather     Comment: during surgery for bypass    Hypertension Maternal Grandmother     Lipids FamHxNeg     Psychiatric Illness FamHxNeg     Stroke FamHxNeg     Thyroid FamHxNeg     Cancer - Lung Maternal Grandfather        ROS:  Constitutional: benign  Allergy: none  Endocrine: negative  Dermatologic: negative  Cardiovascular: negative  Respiratory: negative  Gastrointestinal: negative  Genitourinary: negative  Musculoskeletal: negative  Neurological: negative  Psychiatric: negative    PHYSICAL EXAM:  Constitutional: well developed, well nourished [place] female  Skin: clear  Neurological: normal and alert and oriented  Chest: clear  Heart: regular rate and rhythm  Breasts: no masses, skin, nipple or axillary changes  Abdomen: no masses or tenderness    PELVIC:  External Genitalia: normal architecture  Vagina: well rugated and no lesions  Vaginal Discharge: normal appearing, bloody discharge  Pelvic supports: normal  Cervix: no lesions  Uterus: anteverted, normal size and non-tender  Adnexa: no masses, nodularity, tenderness    Extremities: normal    Urine pregnancy test POSITIVE      ASSESSMENT & PLAN:  36 year old G7P3 here for routine GYN. Complained of abnormal bleeding.   Urine pregnancy test is positive. Had desired IUD placement but this was deferred given circumstances.  She know that she is probably have another miscarriage.   We will check serum HCQ quant and ultrasound.  She still desires Christean Grief IUD placement if this is indeed a complete SAB. If not she will return to discuss her options.   Will discontinue her OCPs today.       (V72.31) Routine  gynecological examination  (primary encounter diagnosis)    Plan: AMPLIFIED GENPROBE CHLAM/GC, URINE         CULTURE/COLONY COUNT, URINE PREGNANCY TEST         (POINT OF CARE), HCG QUANTITATIVE, ORDER FOR         ULTRASOUND        (V22.2) Pregnancy as incidental finding  Plan: HCG QUANTITATIVE, ORDER FOR ULTRASOUND

## 2011-09-20 ENCOUNTER — Ambulatory Visit (HOSPITAL_BASED_OUTPATIENT_CLINIC_OR_DEPARTMENT_OTHER): Payer: Self-pay | Admitting: Obstetrics & Gynecology

## 2011-09-20 ENCOUNTER — Encounter (HOSPITAL_BASED_OUTPATIENT_CLINIC_OR_DEPARTMENT_OTHER): Payer: Self-pay | Admitting: Obstetrics & Gynecology

## 2011-09-20 LAB — CHLAMYDIA GC NAAT
GENPROBE CHLAMYDIA: NEGATIVE
GENPROBE GC: NEGATIVE

## 2011-09-20 LAB — URINE CULTURE/COLONY COUNT

## 2011-09-20 LAB — US OB 1ST TRIMESTER

## 2011-09-20 LAB — US PREG UTERUS REAL TIME W/IMAGE DCMTN TRANSVAG

## 2011-09-21 ENCOUNTER — Ambulatory Visit (HOSPITAL_BASED_OUTPATIENT_CLINIC_OR_DEPARTMENT_OTHER): Payer: Medicaid Other | Admitting: Internal Medicine

## 2011-09-21 ENCOUNTER — Telehealth (HOSPITAL_BASED_OUTPATIENT_CLINIC_OR_DEPARTMENT_OTHER): Payer: Self-pay | Admitting: Registered Nurse

## 2011-09-21 DIAGNOSIS — O039 Complete or unspecified spontaneous abortion without complication: Principal | ICD-10-CM

## 2011-09-21 NOTE — Telephone Encounter (Signed)
Message copied by Olevia Bowens on Thu Sep 21, 2011  9:53 AM  ------       Message from: Larena Glassman       Created: Thu Sep 21, 2011  9:36 AM       Regarding: U/s results       Contact: [phone]                Call back # [phone]              Interpreter:port              Person Calling:pt              Reason for todays call?: Pt saw Dr.Muchura Tuesday and had a u/s done yesterday was told she could call today and get the results.                     Sick call:              Symptoms:              How long have you been sick?:                             Reason to speak with a nurse only?              Reason to speak with a provider only?                            Other call: Reason for call?                 ------

## 2011-09-21 NOTE — Progress Notes (Signed)
Spoke with husband who is in house now  She will come tomorrow to draw HCG  They will come Tuesday to see Dr. Pam Drown with plan

## 2011-09-21 NOTE — Progress Notes (Signed)
Pt calling for u/s results.  Per Epic:  IMPRESSION: There is no intrauterine gestation. Consider follow-up   ultrasound as clinically indicated.                   Nurse needs f/u plan to discuss with pt.  Sending to ordering provider.      Dr Elliot Gurney:  Please review and advise nurse what f/u plan is.  Send to RN pool, please.    Tx

## 2011-09-21 NOTE — Progress Notes (Signed)
Patient husband here to see Dr. Homero Fellers  She also explained the situation in detail  They were planning to go out of town, but will not  emphasized importance of coming to lab tomorrow and to see Dr. Elliot Gurney on Tuesday  Agrees with plan

## 2011-09-21 NOTE — Progress Notes (Signed)
To the [facility] nursing pool:  Clinical impression by Dr. Elliot Gurney was that the patient was having an SAB.  Please have the patient get another BHCG quant tomorrow.  Future order placed.    Cc Dr. Elliot Gurney

## 2011-09-22 ENCOUNTER — Ambulatory Visit (HOSPITAL_BASED_OUTPATIENT_CLINIC_OR_DEPARTMENT_OTHER): Payer: Medicaid Other

## 2011-09-22 DIAGNOSIS — O039 Complete or unspecified spontaneous abortion without complication: Principal | ICD-10-CM

## 2011-09-22 LAB — HCG QUANTITATIVE: HCG QUANTITATIVE: 989 m[IU]/mL — ABNORMAL HIGH (ref 1–3)

## 2011-09-22 NOTE — Progress Notes (Signed)
Labs drawn

## 2011-09-25 ENCOUNTER — Telehealth (HOSPITAL_BASED_OUTPATIENT_CLINIC_OR_DEPARTMENT_OTHER): Payer: Self-pay

## 2011-09-25 NOTE — Telephone Encounter (Signed)
Message copied by Dineen Kid on Mon Sep 25, 2011  3:55 PM  ------       Message from: Larena Glassman       Created: Mon Sep 25, 2011  3:35 PM       Regarding: Sab       Contact: [phone]                Call back # [phone]              Interpreter:port              Person Calling:husband-Eduardo              Reason for todays call?: Pt suffered a miscarriage a few days ago,pt was told she needed to have a u/s today,she called radiology and they don't have an order.                     Sick call:              Symptoms:              How long have you been sick?:                             Reason to speak with a nurse only?              Reason to speak with a provider only?                            Other call: Reason for call?                 ------

## 2011-09-25 NOTE — Progress Notes (Signed)
Pt has apt tomorrow with Dr. Elliot Gurney, no tests ordered.  Husband & pt anxious, moving to [facility]. Tomorrow night & will be back @ end of summer.  The patient indicates understanding of these issues and agrees with the plan.

## 2011-09-26 ENCOUNTER — Telehealth (HOSPITAL_BASED_OUTPATIENT_CLINIC_OR_DEPARTMENT_OTHER): Payer: Self-pay | Admitting: Obstetrics & Gynecology

## 2011-09-26 ENCOUNTER — Encounter (HOSPITAL_BASED_OUTPATIENT_CLINIC_OR_DEPARTMENT_OTHER): Payer: Self-pay | Admitting: Obstetrics & Gynecology

## 2011-09-26 ENCOUNTER — Ambulatory Visit (HOSPITAL_BASED_OUTPATIENT_CLINIC_OR_DEPARTMENT_OTHER): Payer: Medicaid Other | Admitting: Obstetrics & Gynecology

## 2011-09-26 VITALS — BP 100/60 | Wt 152.5 lb

## 2011-09-26 DIAGNOSIS — O039 Complete or unspecified spontaneous abortion without complication: Principal | ICD-10-CM

## 2011-09-26 LAB — HCG QUANTITATIVE: HCG QUANTITATIVE: 398 m[IU]/mL — ABNORMAL HIGH (ref 1–3)

## 2011-09-26 NOTE — Progress Notes (Signed)
Claudia Lawrence is here for follow-up.  She presented for routine GYN visit and IUD placement but was diagnosed with pregnancy.  She had been having a lot of vaginal bleeding that she thought was her period.   Ultrasound showed no IUP. HCG 1 week ago was 3110 and had decreased to just under 1000 2 days later.   Bleeding is minimal now. She has had no abdominal pain whatsoever.   Is aware that this was most likely an SAB.  She is planning a trip to [place]. She will be driving down to stay with Family for 1 month.  I have low suspicion for ectopic but I discussed with patient that it is still a possibility.   We will draw HCG quant today and repeat ultrasound. If continuing to decrease and negative [place] then I discussed that she may drive to [place].  I have asked her to have her HCG drawn there in 1-2 weeks and to email me the result.  She will then follow-up with me upon her return for placement of IUD.

## 2011-09-26 NOTE — Progress Notes (Signed)
Quick Note:    HCG decreased by 60%. Likely SAB.   Patient will be informed by phone.  ______

## 2011-09-26 NOTE — Progress Notes (Signed)
Contacted patient to let her know that HCG has decreased further to just over 300.    Was not able to reach patient. Left generic VM asking to call clinic.  Please let her know the above message.   Awaiting ultrasound and results.     Thanks  Matilde Bash MD

## 2011-09-26 NOTE — Progress Notes (Signed)
Pt feels safe at home

## 2011-09-27 ENCOUNTER — Ambulatory Visit (HOSPITAL_BASED_OUTPATIENT_CLINIC_OR_DEPARTMENT_OTHER): Payer: Self-pay | Admitting: Obstetrics & Gynecology

## 2011-09-27 LAB — US PREG UTERUS REAL TIME W/IMAGE DCMTN TRANSVAG

## 2011-09-27 LAB — US OB 1ST TRIMESTER

## 2011-09-27 NOTE — Progress Notes (Signed)
Spoke with patient  relayed results  She will be doing [place] today  Agrees with plan

## 2011-09-27 NOTE — Telephone Encounter (Signed)
Message copied by Johnette Abraham on Wed Sep 27, 2011 10:31 AM  ------       Message from: Ihor Dow       Created: Wed Sep 27, 2011 10:17 AM       Regarding: Korayma Schuckman [id number], 36 year old, female, Telephone Information:       Mobile          [phone]                     Patient's Preferred Pharmacy:               [phone] [place] [place] - [address] AT [address]       Phone: [phone] Fax: [phone]              [facility]       Phone: [phone] Fax: [phone]                     CONFIRMED TODAY: Yes              Patient's language of care: Portuguese              Patient needs a [place] interpreter.              Patient's PCP: Eileen Stanford, MD              Person calling on behalf of patient: Patient (self)              Calls today Returning phone call                        ------

## 2011-09-27 NOTE — Progress Notes (Signed)
Left a message on an identified voice mail using telephone interpreter  To call clinic if still with questions or concerns  Await a call back

## 2011-11-07 ENCOUNTER — Ambulatory Visit (HOSPITAL_BASED_OUTPATIENT_CLINIC_OR_DEPARTMENT_OTHER): Payer: Self-pay | Admitting: Obstetrics & Gynecology

## 2011-11-07 ENCOUNTER — Telehealth (HOSPITAL_BASED_OUTPATIENT_CLINIC_OR_DEPARTMENT_OTHER): Payer: Self-pay

## 2011-11-07 NOTE — Progress Notes (Signed)
Left message informing pt to call [facility] to r/s missed appt with Dr Elliot Gurney.

## 2011-12-13 ENCOUNTER — Telehealth (HOSPITAL_BASED_OUTPATIENT_CLINIC_OR_DEPARTMENT_OTHER): Payer: Self-pay | Admitting: Obstetrics & Gynecology

## 2011-12-13 NOTE — Progress Notes (Signed)
Returned patient call. I left a message for the patient to return my call.

## 2011-12-13 NOTE — Telephone Encounter (Signed)
Message copied by Simonne Martinet on Wed Dec 13, 2011  4:27 PM  ------       Message from: Jeryl Columbia       Created: Wed Dec 13, 2011  4:13 PM       Regarding: f/up HCG-missed appt       Contact: [phone]                       Claudia Lawrence [id number], 36 year old, female, Telephone Information:       Home Phone      [phone]       Work Phone      [phone]       Mobile          [phone]                     Patient's Preferred Pharmacy:               [id number] [place], [place] - [address] AT [facility]       Phone: [phone] Fax: [phone]              [facility] ([facility])       Phone: [phone] Fax: [phone]                     CONFIRMED TODAY: No              CALL BACK NUMBER: [phone]       Best time to call back:        Cell phone:        Other phone:              Available times:              Patient's language of care: [place]              Patient does not need an interpreter.              Patient's PCP: Eileen Stanford, MD              Person calling on behalf of patient: Patient (self)              Calls today to schedule appt with Dr Elliot Gurney for F/u HCG.                ------

## 2011-12-14 ENCOUNTER — Telehealth (HOSPITAL_BASED_OUTPATIENT_CLINIC_OR_DEPARTMENT_OTHER): Payer: Self-pay

## 2011-12-14 NOTE — Progress Notes (Addendum)
Called transferred by Valentina Gu, Pt called to r/s [facility] with Dr Elliot Gurney. I r/s appt for 10/4. Appt time/date ok per pt.

## 2011-12-28 ENCOUNTER — Telehealth (HOSPITAL_BASED_OUTPATIENT_CLINIC_OR_DEPARTMENT_OTHER): Payer: Self-pay | Admitting: Obstetrics & Gynecology

## 2011-12-28 NOTE — Progress Notes (Signed)
Appointment reminder.I left a message for the patient to return my call.

## 2011-12-29 ENCOUNTER — Ambulatory Visit (HOSPITAL_BASED_OUTPATIENT_CLINIC_OR_DEPARTMENT_OTHER): Payer: Self-pay | Admitting: Obstetrics & Gynecology

## 2013-08-11 ENCOUNTER — Encounter (HOSPITAL_BASED_OUTPATIENT_CLINIC_OR_DEPARTMENT_OTHER): Payer: Self-pay | Admitting: Obstetrics & Gynecology

## 2014-01-16 ENCOUNTER — Encounter (HOSPITAL_BASED_OUTPATIENT_CLINIC_OR_DEPARTMENT_OTHER): Payer: Self-pay | Admitting: Internal Medicine

## 2014-01-16 ENCOUNTER — Ambulatory Visit (HOSPITAL_BASED_OUTPATIENT_CLINIC_OR_DEPARTMENT_OTHER): Payer: PRIVATE HEALTH INSURANCE | Admitting: Internal Medicine

## 2014-01-16 VITALS — BP 100/60 | HR 86 | Temp 98.1°F | Wt 169.0 lb

## 2014-01-16 DIAGNOSIS — O039 Complete or unspecified spontaneous abortion without complication: Secondary | ICD-10-CM

## 2014-01-16 DIAGNOSIS — Z111 Encounter for screening for respiratory tuberculosis: Secondary | ICD-10-CM

## 2014-01-16 DIAGNOSIS — N96 Recurrent pregnancy loss: Secondary | ICD-10-CM

## 2014-01-16 DIAGNOSIS — Z23 Encounter for immunization: Secondary | ICD-10-CM

## 2014-01-16 DIAGNOSIS — N12 Tubulo-interstitial nephritis, not specified as acute or chronic: Principal | ICD-10-CM

## 2014-01-16 LAB — THYROID SCREEN TSH REFLEX FT4: THYROID SCREEN TSH REFLEX FT4: 1.35 u[IU]/mL (ref 0.358–3.740)

## 2014-01-16 LAB — HCG QUANTITATIVE: HCG QUANTITATIVE: 1626 m[IU]/mL — ABNORMAL HIGH (ref 0–4)

## 2014-01-16 MED ORDER — TUBERCULIN PPD 5 UNIT/0.1ML ID SOLN
0.10 mL | Freq: Once | INTRADERMAL | Status: AC
Start: 2014-01-16 — End: 2014-01-16
  Administered 2014-01-16: 5 [IU] via INTRADERMAL

## 2014-01-16 NOTE — Progress Notes (Signed)
Claudia Lawrence is a 38 year old female    Patient presents with:  Initial Post-hospital BH Visit: kidney infection, miscarriage and blood infection per pt / urine @lab    Medical Clearance: for work, pt needs letter   Imm/Inj: Flu vaccine and PPD for work?      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 She started with fevers that made her visit ED on 01/03/14.  She had no other symptoms at the time but fevers where very high.  She was found to have kidney and blood infection and also miscarriage.  She stayed in the hospital for 2 days. Bring sin some paperwork from this hospitalization, face sheet with some information, no full discharge summary.       She completed antibiotics today last dose.    She was diagnosed with pyelonephritis, bacteremia, miscarriage, sepsis, vaginal bleeding in pregnancy.    U culture grew e.coli 01/04/14.    Blood culture grew beta hemolytic strep B 01/05/14.    Feeling well, no more fevers. No abdominal pain. No nausea or vomit.     Still with vaginal bleeding but now is much less.   Had HCG 01/06/14 at 7385 mIU/ml.   She is not actively looking into getting pregnant.  Husband planning for vasectomy.     Needs PPD and influenza for school.           Physical Exam   Constitutional: She is oriented to person, place, and time. She appears well-developed. No distress.   BP 100/60 mmHg   Pulse 86   Temp(Src) 98.1 F (36.7 C) (Oral)   Wt 76.658 kg (169 lb)   LMP  (LMP Unknown)   Breastfeeding? Unknown     Cardiovascular: Normal rate, regular rhythm and normal heart sounds.  Exam reveals no gallop and no friction rub.    No murmur heard.  Pulmonary/Chest: Effort normal and breath sounds normal. No respiratory  distress. She has no wheezes. She has no rales.   Abdominal: Soft. Normal appearance and bowel sounds are normal. She exhibits no distension. There is no tenderness. There is no CVA tenderness.   Neurological: She is alert and oriented to person, place, and time.   Psychiatric: She has a normal mood and affect.         ASSESSMENT/PLAN:  (N12) Pyelonephritis  (primary encounter diagnosis)  Comment: no signs or symptoms of this now. Has completed antibiotic course.  Return instructions provided.   Had positive blood culture for strep B,  will obtain her full medical records for this recent hospitalization.      (N96) Recurrent pregnancy loss without current pregnancy  She plans to avoid pregnancy by using condom consistently and husband to have vasectomy.   She does not need fertility evaluation but given recurrent pregnancy losses will check lupus anticoagulant, anticardiolipin Ab, TSH.  Follow up with new PCP.  Plan: ANTICARDIOLIPIN ANTIBODIES, LUPUS ANTICOAGULANT        PANEL, THYROID SCREEN TSH REFLEX FT4    (O03.9) Miscarriage  Comment: has had multiple in the past.  Has seen ob/gyn.  Monitor today for decrease in her HCG quant.  Still some vaginal bleeding. Will contact her with results to make sure improved.    Plan: HCG QUANTITATIVE    (Z23) Flu vaccine need  Plan: PR INFLUENZA VACCINE QUADRIVALENT 3 YRS PLUS IM        (PRIVATE)    (Z11.1) PPD screening test  Plan: tuberculin (PPD) PLANT, tuberculin (PPD) READ,         tuberculin (PPD) READ          We discussed the patient's current medications. The patient expressed understanding and no barriers to adherence were identified.

## 2014-01-16 NOTE — Progress Notes (Signed)
Pt feels safe at home

## 2014-01-16 NOTE — Progress Notes (Signed)
S:  Claudia Lawrence is here for a PPD plant.  1)  Has your arm ever turned red, bumpy, swollen or sore after receiving a PPD test for TB? No  2)  Have you ever had the disease Tuberculosis? No  3)  Have you traveled outside the country over the past 2 years? No  4)  Are you taking steroid medication? No  5)  Has there been any cancer or illnesses resulting in a decreased immune system recently? No  6)  Have you ever received BCG? No  6)  ROS of TB symptoms: None      O:  PPD planted.  0.1cc Mantox ID, left forearm.    A:  PPD plant, screen TB.    P:  Pt advised to return in 48-72 hours for appointment to interpret PPD results.       Scheduled in a nursing visit for follow up and documentation of completion.    Hospital Administered Immunizations Administered on Date of Encounter - 01/16/2014  Never Reviewed           Influenza Virus Quadrivalent Vacc 3/> Yrs Im           VIS given prior to administration and reviewed with the patient. Patient understands the disease and the vaccine. See immunization/Injection module or chart review for date of VIS publication and additional administration information.      No additional questions at this time.

## 2014-01-19 ENCOUNTER — Ambulatory Visit (HOSPITAL_BASED_OUTPATIENT_CLINIC_OR_DEPARTMENT_OTHER): Payer: PRIVATE HEALTH INSURANCE | Admitting: Registered Nurse

## 2014-01-19 DIAGNOSIS — Z111 Encounter for screening for respiratory tuberculosis: Principal | ICD-10-CM

## 2014-01-19 LAB — SKIN TEST TUBERCULOSIS INTRADERMAL: PPD: 0 mm

## 2014-01-19 NOTE — Progress Notes (Signed)
Error

## 2014-01-19 NOTE — Progress Notes (Signed)
Claudia Lawrence is here for a PPD read.  PPD planted left forearm on 01/16/14.  No erthyema, 60mm induration left forearm. Negative PPD read.  F/u as scheduled with provider.

## 2014-01-20 LAB — LUPUS ANTICOAGULANT PANEL
DILUTE RUSSELL'S VIPER VENOM: 33.6 s (ref 0.0–55.1)
PTT-LA LUPUS ANTICOAGUL SCREEN: 37.8 s (ref 0.0–50.0)

## 2014-01-20 LAB — ANTICARDIOLIPIN ANTIBODIES
ANTICARDIOLIPIN ANTIBODY IGG: <9 GPL U/mL (ref 0–14)
ANTICARDIOLIPIN ANTIBODY IGM: <9 MPL U/mL (ref 0–12)

## 2014-01-21 ENCOUNTER — Telehealth (HOSPITAL_BASED_OUTPATIENT_CLINIC_OR_DEPARTMENT_OTHER): Payer: Self-pay | Admitting: Internal Medicine

## 2014-01-21 DIAGNOSIS — O039 Complete or unspecified spontaneous abortion without complication: Principal | ICD-10-CM

## 2014-01-21 NOTE — Progress Notes (Signed)
Please call her to let her know her most recent tests came back all within normal except the pregnancy hormone (HCG).  It is much lower than when she was discharged at Had HCG 01/06/14 at 7385 mIU/ml, now down to 1626.  I still recommend we trend this down again at some point later this week- new future order in place.  Happy to talk to her if needed.

## 2014-01-22 NOTE — Progress Notes (Signed)
Call to pt.  Lm on Vm to return [facility] RN re: lab results.

## 2014-01-23 NOTE — Progress Notes (Signed)
Called pt no answer left message to call Jadaya Sommerfield,rn at [phone]

## 2014-01-23 NOTE — Progress Notes (Signed)
Discussed labs with patient now. She understood and agrees with the plan.

## 2014-03-19 ENCOUNTER — Ambulatory Visit (HOSPITAL_BASED_OUTPATIENT_CLINIC_OR_DEPARTMENT_OTHER): Payer: No Typology Code available for payment source | Admitting: Family Medicine

## 2014-06-22 ENCOUNTER — Encounter (HOSPITAL_BASED_OUTPATIENT_CLINIC_OR_DEPARTMENT_OTHER): Payer: Self-pay | Admitting: Internal Medicine

## 2014-06-22 ENCOUNTER — Ambulatory Visit (HOSPITAL_BASED_OUTPATIENT_CLINIC_OR_DEPARTMENT_OTHER): Payer: No Typology Code available for payment source | Admitting: Internal Medicine

## 2014-06-22 VITALS — BP 110/70 | HR 85 | Temp 97.5°F | Resp 18 | Ht 61.0 in | Wt 169.0 lb

## 2014-06-22 DIAGNOSIS — J208 Acute bronchitis due to other specified organisms: Principal | ICD-10-CM

## 2014-06-22 MED ORDER — GUAIFENESIN-CODEINE 100-10 MG/5ML PO SOLN
5.00 mL | Freq: Three times a day (TID) | ORAL | Status: AC | PRN
Start: 2014-06-22 — End: 2015-06-22

## 2014-06-22 MED ORDER — ALBUTEROL SULFATE HFA 108 (90 BASE) MCG/ACT IN AERS
2.0000 | INHALATION_SPRAY | Freq: Four times a day (QID) | RESPIRATORY_TRACT | Status: DC | PRN
Start: 2014-06-22 — End: 2014-06-22

## 2014-06-22 MED ORDER — ALBUTEROL SULFATE HFA 108 (90 BASE) MCG/ACT IN AERS
2.0000 | INHALATION_SPRAY | Freq: Four times a day (QID) | RESPIRATORY_TRACT | Status: AC | PRN
Start: 2014-06-22 — End: 2015-06-22

## 2014-06-22 NOTE — Progress Notes (Signed)
Patient feels safe at home.

## 2014-07-02 ENCOUNTER — Telehealth (HOSPITAL_BASED_OUTPATIENT_CLINIC_OR_DEPARTMENT_OTHER): Payer: Self-pay | Admitting: Registered Nurse

## 2014-07-02 ENCOUNTER — Encounter (HOSPITAL_BASED_OUTPATIENT_CLINIC_OR_DEPARTMENT_OTHER): Payer: Self-pay | Admitting: Internal Medicine

## 2014-07-02 ENCOUNTER — Ambulatory Visit (HOSPITAL_BASED_OUTPATIENT_CLINIC_OR_DEPARTMENT_OTHER): Payer: No Typology Code available for payment source | Admitting: Internal Medicine

## 2014-07-02 VITALS — BP 109/80 | HR 92 | Temp 98.6°F | Wt 168.0 lb

## 2014-07-02 DIAGNOSIS — N926 Irregular menstruation, unspecified: Principal | ICD-10-CM

## 2014-07-02 DIAGNOSIS — O2620 Pregnancy care for patient with recurrent pregnancy loss, unspecified trimester: Secondary | ICD-10-CM

## 2014-07-02 DIAGNOSIS — O2621 Pregnancy care for patient with recurrent pregnancy loss, first trimester: Secondary | ICD-10-CM

## 2014-07-02 DIAGNOSIS — Z7189 Other specified counseling: Secondary | ICD-10-CM

## 2014-07-02 DIAGNOSIS — N10 Acute tubulo-interstitial nephritis: Secondary | ICD-10-CM

## 2014-07-02 DIAGNOSIS — Z3490 Encounter for supervision of normal pregnancy, unspecified, unspecified trimester: Secondary | ICD-10-CM

## 2014-07-02 LAB — HCG QUALITATIVE URINE: HCG QUALITATIVE URINE: POSITIVE

## 2014-07-02 LAB — URINE DIP (POINT OF CARE)
BILIRUBIN, URINE: NEGATIVE
GLUCOSE, URINE: NEGATIVE mg/dl
KETONE, URINE: NEGATIVE mg/dl
NITRITE, URINE: NEGATIVE
PH URINE: 6.5 (ref 5.0–8.0)
PROTEIN, URINE: NEGATIVE mg/dl (ref 0–15)
SPECIFIC GRAVITY URINE: 1.015 (ref 1.003–1.030)
UROBILINOGEN URINE: 0.2 mg/dl (ref 0.2–1.0)

## 2014-07-02 LAB — URINE PREGNANCY TEST (POINT OF CARE): HCG QUALITATIVE URINE: POSITIVE

## 2014-07-02 MED ORDER — NITROFURANTOIN MONOHYD MACRO 100 MG PO CAPS
100.00 mg | ORAL_CAPSULE | Freq: Two times a day (BID) | ORAL | Status: AC
Start: 2014-07-02 — End: 2014-07-09

## 2014-07-02 MED ORDER — LEVOFLOXACIN 500 MG PO TABS
500.0000 mg | ORAL_TABLET | Freq: Every day | ORAL | Status: DC
Start: 2014-07-02 — End: 2014-07-02

## 2014-07-02 NOTE — Progress Notes (Signed)
Call to pt.  Reports has had a 3 day hx of back pain "like when I get urine infections".  Requesting eval asap.  Appt today with urgent care provider.

## 2014-07-02 NOTE — Telephone Encounter (Signed)
-----   Message from Baker sent at 07/02/2014  9:18 AM EDT -----  Regarding: Back pain  Contact: [phone]      Claudia Lawrence [id number], 39 year old, female, Telephone Information:  Home Phone      [phone]  Work Phone      [phone]  Mobile          [phone]      Patient's Preferred Pharmacy:     [id number] [place] [place]. AT [facility]  Phone: [phone] Fax: [phone]    [facility] ([facility])  Phone: [phone] Fax: [phone]      CONFIRMED TODAY: Yes    CALL BACK NUMBER:   Best time to call back:   Cell phone:   Other phone:    Available times:    Patient's language of care: [place]    Patient needs a [place] interpreter.    Patient's PCP: Maple Hudson, MD, MD    Person calling on behalf of patient: Patient (self)    Calls today   Back pain for 3 days.

## 2014-07-02 NOTE — Progress Notes (Signed)
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     Lipoma     Depressive Disorder, not Elsewhere Classified     Migraine without Aura     Lyme disease     Tick bites     Iron deficiency anemia        Past Surgical History    NO SIGNIFICANT SURGICAL HISTORY      CESAREAN BIRTH CLASS      Comment x2    ------------OTHER-------------      Comment REMOVAL OF LIPOMA ON ABDOMEN    OB ANTEPARTUM CARE CESAREAN DLVR & POSTPARTUM         Family History    Cancer - Breast FamHxNeg     Cancer - Cervical FamHxNeg     Cancer - Colon FamHxNeg     Diabetes FamHxNeg     Heart Paternal Grandfather     Comment: during surgery for bypass    Hypertension Maternal Grandmother     Lipids FamHxNeg     Psychiatric Illness FamHxNeg     Stroke FamHxNeg     Thyroid FamHxNeg     Cancer - Lung Maternal Grandfather        Current Outpatient Prescriptions:  guaiFENesin-codeine (CHERATUSSIN AC) 100-10 MG/5ML liquid Take 5 mLs by mouth 3 (three) times daily as needed for Cough. Disp: 120 mL Rfl: 0   albuterol (PROAIR HFA) 108 (90 BASE) MCG/ACT inhaler Inhale 2 puffs into the lungs every 6 (six) hours as needed for Wheezing or Shortness of breath. Disp: 1 Inhaler Rfl: 0     No current facility-administered medications for this visit.  Review of Patient's Allergies indicates:  No Known Allergies  ROS  Flank pain , mild  No fevers          Objective    BP 109/80 mmHg   Pulse 92   Temp(Src) 98.6 F (37 C) (Temporal)   Wt 76.204 kg (168 lb)   SpO2 100%   LMP  06/04/2014  Physical Exam   Abdominal: Soft. Bowel sounds are normal.   Right flank pain     (N92.6) Irregular bleeding  (primary encounter diagnosis)  Comment: with pregnancy , first trimester bleeding   Plan: HCG QUALITATIVE SERUM, HCG QUALITATIVE SERUM,         HCG qualitative is positive, pt is slightly bleeding , and  Thinks she may be miscarrying   Pt knows how this works and she is willing to just track the blood HCG levels for now ,   If she starts bleeding heavily , she should go to the ED   She also will return to clinic on Monday : for repeat B hcg to make sure its trending up             (N10) Acute pyelonephritis  Comment: with mild flank pain and  as she is pregnant we will consider mild antibiotic , Nitrofurantoin, cancel Levaquin which was cancelled  If she doesn't bleed , and her symptoms doesn't get better : she should go to the ED so that she can get IV antibiotics if needed.  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                     We discussed the importance of medication compliance. The patient was ready to learn and no apparent learning barriers were identified. I explained the diagnosis and treatment plan, and the patient  expressed understanding of the content. Possible side effects of the prescribed medication(s) were explained, .  I attempted to answer any questions regarding the diagnosis and the proposed treatment.    We discussed the patients current medications. The patient expressed understanding and no barriers to adherence were identified.      Shaune Pascal MD

## 2014-07-03 LAB — URINE CULTURE

## 2014-07-03 LAB — HCG QUANTITATIVE: HCG QUANTITATIVE: 74351 m[IU]/mL — ABNORMAL HIGH (ref 0–4)

## 2014-07-03 LAB — HCG QUALITATIVE SERUM: HCG QUALITATIVE SERUM: POSITIVE

## 2014-07-03 NOTE — Addendum Note (Signed)
Addended by: Shaune Pascal on: 07/03/2014 02:01 PM     Modules accepted: Orders

## 2014-07-06 ENCOUNTER — Ambulatory Visit (HOSPITAL_BASED_OUTPATIENT_CLINIC_OR_DEPARTMENT_OTHER): Payer: No Typology Code available for payment source

## 2014-07-06 ENCOUNTER — Telehealth (HOSPITAL_BASED_OUTPATIENT_CLINIC_OR_DEPARTMENT_OTHER): Payer: Self-pay

## 2014-07-06 DIAGNOSIS — N939 Abnormal uterine and vaginal bleeding, unspecified: Principal | ICD-10-CM

## 2014-07-06 LAB — HCG QUANTITATIVE: HCG QUANTITATIVE: 141351 m[IU]/mL — ABNORMAL HIGH (ref 0–4)

## 2014-07-06 NOTE — Progress Notes (Signed)
Appointment scheduled to 07/07/14 at 3 pm with Haywood Lasso. Patient will call us back to confirm.

## 2014-07-06 NOTE — Progress Notes (Signed)
Labs drawn at 11:32    1 sst top collected    LDG

## 2014-07-07 ENCOUNTER — Telehealth (HOSPITAL_BASED_OUTPATIENT_CLINIC_OR_DEPARTMENT_OTHER): Payer: Self-pay | Admitting: Internal Medicine

## 2014-07-07 ENCOUNTER — Ambulatory Visit (HOSPITAL_BASED_OUTPATIENT_CLINIC_OR_DEPARTMENT_OTHER): Payer: No Typology Code available for payment source | Admitting: Advanced Practice Midwife

## 2014-07-07 NOTE — Progress Notes (Signed)
Call to pt.  Reports had HCG done yesterday and needs to f/u. Appt booked for this pm, but pt unable to keep.  R/s with RN for Thu.  Many ?'s about results and what they mean etc.  I explained we are measuring her hormone level to see if increasing as it should. We will recheck on Thu and then base f/u on those results.  Pt agrees to keep appt for Thu as scheduled.

## 2014-07-07 NOTE — Progress Notes (Signed)
Spoke with pt to schedule RN visit for hcg testing, appt schedule for 07/09/14, pt agreed. I offered sooner appt but pt declined. Pt ststed that she was having vaginal bleeding and was concerned about it, i informed pt that i will have an RN call her back to assess her vag bleed, pt agreed.

## 2014-07-07 NOTE — Telephone Encounter (Signed)
-----   Message from Tera Partridge sent at 07/07/2014  9:46 AM EDT -----  Regarding: reschedule appt       Claudia Lawrence [id number], 39 year old, female, Telephone Information:  Home Phone      [phone]  Work Phone      [phone]  Mobile          [phone]      Patient's Preferred Pharmacy:     [id number] [place], [place]. AT [facility]  Phone: [phone] Fax: [phone]    [facility] ([facility])  Phone: [phone] Fax: [phone]      CONFIRMED TODAY:     Rod Can NUMBER: [phone]  Best time to call back: anytime   Cell phone:   Other phone:    Available times:    Patient's language of care: [place]    Patient does not need an interpreter.    Patient's PCP: Shah,Sural MD    Person calling on behalf of patient: Patient (self)    Calls today Returning phonecall reschedule appt with GYN

## 2014-07-08 ENCOUNTER — Encounter (HOSPITAL_BASED_OUTPATIENT_CLINIC_OR_DEPARTMENT_OTHER): Payer: Self-pay | Admitting: Internal Medicine

## 2014-07-08 NOTE — Progress Notes (Signed)
Pt is pregnant with up trending B hcg   Did not keep her appointment with Crista Curb Midwife , instead a visit with nurse is scheduled.  Pt is c/o vaginal bleeding .  Pt needs an ultrasound and other follow up which cannot be done by the pcp team.  Pt needs to follow up with womens health team or Midwives.    RNs: please cancel the appointment and schedule appointment with womens health as she needs work up : labs, cbc , and ultrasound which can be done at womens health and can be followed closely.

## 2014-07-09 ENCOUNTER — Ambulatory Visit (HOSPITAL_BASED_OUTPATIENT_CLINIC_OR_DEPARTMENT_OTHER): Payer: No Typology Code available for payment source | Admitting: Registered Nurse

## 2014-07-09 ENCOUNTER — Telehealth (HOSPITAL_BASED_OUTPATIENT_CLINIC_OR_DEPARTMENT_OTHER): Payer: Self-pay | Admitting: Internal Medicine

## 2014-07-09 DIAGNOSIS — O039 Complete or unspecified spontaneous abortion without complication: Principal | ICD-10-CM

## 2014-07-09 LAB — HCG QUANTITATIVE: HCG QUANTITATIVE: 164684 m[IU]/mL — ABNORMAL HIGH (ref 0–4)

## 2014-07-09 NOTE — Progress Notes (Signed)
Pt in for f/u hcg quant.  Reports not bleeding today but had an episode of "pelvic cramping" at work yesterday and episode of vaginal bleeding (dark maroon blood). States spoke with her PCP yesterday and told to see CNM on Mon for f/u (u/s planning etc).  HCG quant drawn today.  Will discuss results with PCP and f/u with pt tom.  She agrees with plan.

## 2014-07-10 NOTE — Telephone Encounter (Signed)
-----   Message from Jalene Mullet sent at 07/10/2014 10:43 AM EDT -----  Regarding: f/up test results      Claudia Lawrence [id number], 39 year old, female, Telephone Information:  Home Phone      [phone]  Work Phone      [phone]  Mobile          [phone]      Patient's Preferred Pharmacy:     [id number] [place], [place]. AT [facility]  Phone: [phone] Fax: [phone]    [facility] ([facility])  Phone: [phone] Fax: [phone]      CONFIRMED TODAY: Christene Lye NUMBER:  [phone]    Best time to call back:anytimeCell phone:   Other phone:    Available times:    Patient's language of care: [place]    Patient does not need an interpreter.    Patient's PCP: Shah,Sural MD    Person calling on behalf of patient: Patient (self)    Calls today to speak to nurse f/u hcg quant. Test done yesterday.    Thanks

## 2014-07-10 NOTE — Progress Notes (Signed)
Results :  Component      Latest Ref Rng 07/06/2014 07/09/2014   HCG QUANTITATIVE      0 - 4 mIU/ml 141351 (H) [id number] (H)       I reviewed the results and she has an u/s appointment on Monday

## 2014-07-13 ENCOUNTER — Ambulatory Visit (HOSPITAL_BASED_OUTPATIENT_CLINIC_OR_DEPARTMENT_OTHER): Payer: No Typology Code available for payment source | Admitting: Advanced Practice Midwife

## 2014-07-13 ENCOUNTER — Telehealth (HOSPITAL_BASED_OUTPATIENT_CLINIC_OR_DEPARTMENT_OTHER): Payer: Self-pay

## 2014-07-13 ENCOUNTER — Ambulatory Visit (HOSPITAL_BASED_OUTPATIENT_CLINIC_OR_DEPARTMENT_OTHER): Payer: Self-pay | Admitting: Advanced Practice Midwife

## 2014-07-13 ENCOUNTER — Encounter (HOSPITAL_BASED_OUTPATIENT_CLINIC_OR_DEPARTMENT_OTHER): Payer: Self-pay | Admitting: Advanced Practice Midwife

## 2014-07-13 VITALS — BP 102/68 | HR 80 | Temp 98.2°F | Ht 61.0 in | Wt 168.0 lb

## 2014-07-13 DIAGNOSIS — O209 Hemorrhage in early pregnancy, unspecified: Principal | ICD-10-CM

## 2014-07-13 LAB — US OB 1ST TRIMESTER W TV

## 2014-07-13 NOTE — Progress Notes (Signed)
Pt here for pregnancy and first trimester bleeding  She has had a number of SABs in past -->4 C/S   x 3  Husband had vascetomy last month  This not a planned pregnancy  Feels R flank pain that goes from back to front periodically  No very painful now describes as cramping  Bleeding irregular last month . Spotting since ~ 07/02/2014 + pregnancy test  Brownish reddish  Treated for UTI  LMP 06/04/2014  Blood type B positive    HCG QUANTITATIVE (mIU/ml)   Date Value   07/09/2014 [id number]*   07/06/2014 [id number]*   07/02/2014 94473*   ----------    Advised patient that quantative values are not as we expect for viable / normal pregnancy  It appears that she is having a miscarriage . Pt stated she expecting that and she has been through it before  O:      Vulva wnl      Vagina no lesions no blood      Cx no lesions      Bimanual exam uterus non tender size multiparous large 10 wks size no adnexal masses palpated bilaterally  (O20.9) First trimester bleeding  (primary encounter diagnosis)  Comment: Consistent with SAB  Plan: [place] PREGNANT UTERUS 43 WK TRANSABDL 1/1ST GESTAT      Review with patient signs of SAB  Review if IUP and as we expect not viable options expectant. Medical SAB /Surgical  Pt wants to wait a week to see what happens  Review signs to go to ER severe pain, heavy bleeding , fever  Any other concerns to call  Ultrasound will call midwife on call to review ultrasound report  appt with patient 1 wk  Haywood Lasso CNM             Ultrasound viable 8 wks   LM on VM  Will see patient next week  Haywood Lasso CNM

## 2014-07-13 NOTE — Progress Notes (Signed)
Appointment available for this morning. I left a message for the patient to return my call.

## 2014-07-15 ENCOUNTER — Emergency Department (HOSPITAL_BASED_OUTPATIENT_CLINIC_OR_DEPARTMENT_OTHER)
Admission: RE | Admit: 2014-07-15 | Disposition: A | Discharge status: Home / Self Care | Payer: Self-pay | Source: Emergency Department | Attending: Emergency Medicine | Admitting: Emergency Medicine

## 2014-07-15 ENCOUNTER — Telehealth (HOSPITAL_BASED_OUTPATIENT_CLINIC_OR_DEPARTMENT_OTHER): Payer: Self-pay

## 2014-07-15 ENCOUNTER — Encounter (HOSPITAL_BASED_OUTPATIENT_CLINIC_OR_DEPARTMENT_OTHER): Payer: Self-pay

## 2014-07-15 LAB — CBC, PLATELET & DIFFERENTIAL
ABSOLUTE BASO COUNT: 0 10*3/uL (ref 0.0–0.1)
ABSOLUTE EOSINOPHIL COUNT: 0 10*3/uL (ref 0.0–0.8)
ABSOLUTE IMM GRAN COUNT: 0.06 10*3/uL — ABNORMAL HIGH (ref 0.00–0.03)
ABSOLUTE LYMPH COUNT: 0.6 10*3/uL (ref 0.6–5.9)
ABSOLUTE MONO COUNT: 0.9 10*3/uL (ref 0.2–1.4)
ABSOLUTE NEUTROPHIL COUNT: 18.9 10*3/uL — ABNORMAL HIGH (ref 1.6–8.3)
BASOPHIL %: 0 % (ref 0.0–1.2)
EOSINOPHIL %: 0 % (ref 0.0–7.0)
HEMATOCRIT: 34.5 % (ref 34.1–44.9)
HEMOGLOBIN: 11.6 g/dL (ref 11.2–15.7)
IMMATURE GRANULOCYTE %: 0.3 % (ref 0.0–0.4)
LYMPHOCYTE %: 2.7 % — CL (ref 15.0–54.0)
MEAN CORP HGB CONC: 33.6 g/dL (ref 31.0–37.0)
MEAN CORPUSCULAR HGB: 24 pg — ABNORMAL LOW (ref 26.0–34.0)
MEAN CORPUSCULAR VOL: 71.4 fL — ABNORMAL LOW (ref 80.0–100.0)
MEAN PLATELET VOLUME: 10.1 fL (ref 8.7–12.5)
MONOCYTE %: 4.5 % (ref 4.0–13.0)
NEUTROPHIL %: 92.5 % (ref 40.0–75.0)
PLATELET COUNT: 279 10*3/uL (ref 150–400)
RBC DISTRIBUTION WIDTH STD DEV: 45.2 fL (ref 35.1–46.3)
RBC DISTRIBUTION WIDTH: 17.5 % — ABNORMAL HIGH (ref 11.5–14.3)
RED BLOOD CELL COUNT: 4.83 M/uL (ref 3.90–5.20)
WHITE BLOOD CELL COUNT: 20.5 10*3/uL — ABNORMAL HIGH (ref 4.0–11.0)

## 2014-07-15 LAB — URINALYSIS
BILIRUBIN, URINE: NEGATIVE
CASTS: NONE SEEN PER LPF
CRYSTALS: NONE SEEN
GLUCOSE, URINE: NEGATIVE MG/DL
KETONE, URINE: >=80 MG/DL — AB
LEUKOCYTE ESTERASE: NEGATIVE
NITRITE, URINE: NEGATIVE
PH URINE: 6 (ref 5.0–8.0)
PROTEIN, URINE: NEGATIVE MG/DL
SPECIFIC GRAVITY URINE: 1.03 (ref 1.003–1.035)
SQUAMOUS EPITHELIAL CELLS: >10 PER LPF — AB (ref 0–4)

## 2014-07-15 LAB — COMPREHENSIVE METABOLIC PANEL
ALANINE AMINOTRANSFERASE: 19 U/L (ref 12–45)
ALBUMIN: 3.6 g/dL (ref 3.4–5.0)
ALKALINE PHOSPHATASE: 55 U/L (ref 45–117)
ANION GAP: 10 mmol/L (ref 5–15)
ASPARTATE AMINOTRANSFERASE: 14 U/L (ref 8–34)
BILIRUBIN TOTAL: 0.8 mg/dL (ref 0.2–1.0)
BUN (UREA NITROGEN): 9 mg/dL (ref 7–18)
CALCIUM: 9.1 mg/dL (ref 8.5–10.1)
CARBON DIOXIDE: 24 mmol/L (ref 21–32)
CHLORIDE: 104 mmol/L (ref 98–107)
CREATININE: 0.7 mg/dL (ref 0.4–1.2)
ESTIMATED GLOMERULAR FILT RATE: >60 mL/min (ref >60)
Glucose Random: 106 mg/dL (ref 74–160)
POTASSIUM: 3.6 mmol/L (ref 3.5–5.1)
SODIUM: 138 mmol/L (ref 136–145)
TOTAL PROTEIN: 7.6 g/dL (ref 6.4–8.2)

## 2014-07-15 LAB — URINE PREGNANCY TEST (POINT OF CARE): HCG QUALITATIVE URINE: POSITIVE

## 2014-07-15 LAB — TYPE AND SCREEN
ABO/RH INTERPRETATION: B POS
ANTIBODY SCREEN SOLID PHASE: NEGATIVE

## 2014-07-15 LAB — US OB 1ST TRIMESTER W TV, 3D

## 2014-07-15 LAB — HOLD BLUE TOP TUBE

## 2014-07-15 LAB — RBCMORPH

## 2014-07-15 LAB — HCG QUANTITATIVE: HCG QUANTITATIVE: 183835 m[IU]/mL — ABNORMAL HIGH (ref 0–4)

## 2014-07-15 MED ORDER — ACETAMINOPHEN 325 MG PO TABS
975.0000 mg | ORAL_TABLET | Freq: Once | ORAL | Status: AC
Start: 2014-07-15 — End: 2014-07-15
  Administered 2014-07-15: 975 mg via ORAL
  Filled 2014-07-15: qty 3

## 2014-07-15 MED ORDER — ONDANSETRON HCL 4 MG/2ML IJ SOLN
4.00 mg | Freq: Once | INTRAMUSCULAR | Status: AC
Start: 2014-07-15 — End: 2014-07-15
  Administered 2014-07-15: 4 mg via INTRAVENOUS
  Filled 2014-07-15: qty 2

## 2014-07-15 MED ORDER — SODIUM CHLORIDE 0.9 % IV BOLUS
1000.0000 mL | Freq: Once | INTRAVENOUS | Status: AC
Start: 2014-07-15 — End: 2014-07-15
  Administered 2014-07-15: 1000 mL via INTRAVENOUS

## 2014-07-15 MED ORDER — ONDANSETRON HCL 4 MG PO TABS
4.00 mg | ORAL_TABLET | Freq: Four times a day (QID) | ORAL | Status: AC | PRN
Start: 2014-07-15 — End: 2014-07-20

## 2014-07-15 MED ORDER — METHYLERGONOVINE MALEATE 0.2 MG/ML IJ SOLN
200.0000 ug | Freq: Once | INTRAMUSCULAR | Status: AC
  Administered 2014-07-15: 200 ug via INTRAMUSCULAR
  Filled 2014-07-15: qty 1

## 2014-07-15 MED ORDER — SODIUM CHLORIDE 0.9 % IV BOLUS
1000.00 mL | Freq: Once | INTRAVENOUS | Status: AC
Start: 2014-07-15 — End: 2014-07-15
  Administered 2014-07-15: 1000 mL via INTRAVENOUS

## 2014-07-15 MED ORDER — ONDANSETRON HCL 4 MG/2ML IJ SOLN
4.0000 mg | Freq: Once | INTRAMUSCULAR | Status: AC
Start: 2014-07-15 — End: 2014-07-15
  Administered 2014-07-15: 4 mg via INTRAVENOUS
  Filled 2014-07-15: qty 2

## 2014-07-15 MED ORDER — METHYLERGONOVINE MALEATE 0.2 MG PO TABS
0.20 mg | ORAL_TABLET | Freq: Four times a day (QID) | ORAL | Status: AC
Start: 2014-07-15 — End: 2014-07-16

## 2014-07-15 MED ORDER — KETOROLAC TROMETHAMINE 30 MG/ML INJ
30.00 mg | Freq: Once | Status: AC
Start: 2014-07-15 — End: 2014-07-15
  Administered 2014-07-15: 30 mg via INTRAVENOUS
  Filled 2014-07-15: qty 1

## 2014-07-15 MED ORDER — IBUPROFEN 800 MG PO TABS
800.0000 mg | ORAL_TABLET | Freq: Three times a day (TID) | ORAL | Status: AC | PRN
Start: 2014-07-15 — End: 2014-07-20

## 2014-07-15 NOTE — Telephone Encounter (Signed)
-----   Message from Tera Partridge sent at 07/15/2014 11:56 AM EDT -----  Regarding: prenatal pt vomiting, nausea?  Contact: [phone]      Claudia Lawrence [id number], 39 year old, female, Telephone Information:  Home Phone      [phone]  Work Phone      [phone]  Mobile          [phone]      Patient's Preferred Pharmacy:     [id number] [place], [place]. AT [facility]  Phone: [phone] Fax: [phone]    [facility] ([facility]  Phone: [phone] Fax: [phone]      CONFIRMED TODAY:     Rod Can NUMBER: [phone]  Best time to call back: anytime  Cell phone:   Other phone:    Available times:    Patient's language of care: [place]    Patient does not need an interpreter.    Patient's PCP: Shah,Sural MD    Person calling on behalf of patient: Patient (self)    Calls today prenatal pt has been vomiting, nausea, headaches and back pain with dizziness husband would like to know it RN could advise pt what she can do?

## 2014-07-15 NOTE — ED Provider Notes (Addendum)
I have reviewed the ED nursing notes and prior records. I have reviewed the patient's past medical history/problem list, allergies, social history and medication list.  I saw this patient primarily.    HPI:  This 39 year old female patient [phone] at approximately [redacted] weeks GA presents for vaginal bleeding. She was seen here on 4/18, had an US done which showed:Findings: Ultrasound imaging of the pelvis was performed. Transvaginal    ultrasound was preformed for better evaluation of the uterus. There    is a single intrauterine gestation with a crown-rump length of 2 cm    corresponding to a 8 weeks 5 days gestational age. A fetal heartbeat    is documented at 164 beats per minute. A yolk sac is identified. There    is a satisfactory amount of amniotic fluid. The gestational sac shape    appears appropriate. There is a tiny subchorionic hematoma The    placenta is not distinctly visible as of yet. The maternal uterus and    both ovaries appear normal.      Impression:     1. Single live intrauterine pregnancy with estimated gestational age    of [redacted] weeks 5 days. The estimated due date is 02/18/2015.    2. Tiny subchorionic hematoma.     She went home and then last night, had increased cramping, passing clots, and bleeding. Bleeding continued this morning and then stopped this afternoon. She c/o nausea, vomiting along with a headache. She has a hx of multiple miscarriages in the past, and thinks that she is again having a miscarriage.    Review of Systems:  Pertinent positives were reviewed as per the HPI above. All other systems were reviewed and are negative.    Past Medical History/Problem List:    Past Medical History    NO SIGNIFICANT INFECTIONS     NO SIGNIFICANT MEDICAL HISTORY     Miscarriage 04/2007    Comment: with D&C; [facility]    UTI      Patient Active Problem List:     GERD (Gastroesophageal Reflux Disease)     Lipoma     Depressive Disorder, not Elsewhere Classified      Migraine without Aura     Lyme disease     Tick bites     Iron deficiency anemia     Pregnancy complicated by previous recurrent miscarriages      Past Surgical History:      Past Surgical History    NO SIGNIFICANT SURGICAL HISTORY      CESAREAN BIRTH CLASS      Comment x2    ------------OTHER-------------      Comment REMOVAL OF LIPOMA ON ABDOMEN    OB ANTEPARTUM CARE CESAREAN DLVR & POSTPARTUM         Medications:     No current facility-administered medications on file prior to encounter.  Current Outpatient Prescriptions on File Prior to Encounter:  guaiFENesin-codeine (CHERATUSSIN AC) 100-10 MG/5ML liquid Take 5 mLs by mouth 3 (three) times daily as needed for Cough. Disp: 120 mL Rfl: 0   albuterol (PROAIR HFA) 108 (90 BASE) MCG/ACT inhaler Inhale 2 puffs into the lungs every 6 (six) hours as needed for Wheezing or Shortness of breath. Disp: 1 Inhaler Rfl: 0       Social History:     Smoking status: Never Smoker     Smokeless tobacco: Never Used    Alcohol Use: No    Comment: denies  Allergies:  Review of Patient's Allergies indicates:  No Known Allergies    Physical Exam:  ED Triage Vitals   Enc Vitals Group      BP 07/15/14 1317 104/63 mmHg      Pulse 07/15/14 1317 107      Resp 07/15/14 1317 18      Temp 07/15/14 1317 98.9 F      Temp src --       SpO2 07/15/14 1317 100 %      Weight 07/15/14 1317 76.204 kg (168 lb)      Height --       Head Cir --       Peak Flow --       Pain Score 07/15/14 1317 10       Pain Loc --       Pain Edu? --       Excl. in GC? --        GENERAL:  Moderate distress.  SKIN:  Warm & Dry, no rash  HEENT:   Atraumatic. PERRL. EOMI.  TOropharynx clear, moist mucous membranes  NECK:  Supple  LUNGS:  Clear to auscultation bilaterally  HEART:  RRR.    ABDOMEN:  Soft, flat, no distention.  Nontender to palpation. No rebound, no guarding  MUSCULOSKELETAL:  No deformities.   GENITOURINARY: no blood in vaginal vault, os closed, no cmt  NEUROLOGIC: no focal deficits  PSYCHIATRIC:  Normal  affect, calm & cooperative    ED Course and Medical Decision-making:    Pertinent labs and imaging studies reviewed.  Emergency Department nursing record was.  Prior records as available electronically through the [facility] record were reviewed.      Results for orders placed or performed during the hospital encounter of 07/15/14 (from the past 24 hour(s))   CBC+Plt with Diff    Collection Time: 07/15/14  1:20 PM   Result Value    WHITE BLOOD CELL COUNT 20.5 (H)    RED BLOOD CELL COUNT 4.83    HEMOGLOBIN 11.6    HEMATOCRIT 34.5    MEAN CORPUSCULAR VOL 71.4 (L)    MEAN CORPUSCULAR HGB 24.0 (L)    MEAN CORP HGB CONC 33.6    RBC DISTRIBUTION WIDTH STD DEV 45.2    RBC DISTRIBUTION WIDTH 17.5 (H)    PLATELET COUNT 279    MEAN PLATELET VOLUME 10.1    NEUTROPHIL % 92.5 (*H)    IMMATURE GRANULOCYTE % 0.3    LYMPHOCYTE % 2.7 (*L)    MONOCYTE % 4.5    EOSINOPHIL % 0.0    BASOPHIL % 0.0    ABSOLUTE NEUTROPHIL COUNT 18.9 (H)    ABSOLUTE IMM GRAN COUNT 0.06 (H)    ABSOLUTE LYMPH COUNT 0.6    ABSOLUTE MONO COUNT 0.9    ABSOLUTE EOSINOPHIL COUNT 0.0    ABSOLUTE BASO COUNT 0.0   Comprehensive Metabolic Panel    Collection Time: 07/15/14  1:20 PM   Result Value    SODIUM 138    POTASSIUM 3.6    CHLORIDE 104    CARBON DIOXIDE 24    ANION GAP 10    CALCIUM 9.1    Glucose Random 106    BUN (UREA NITROGEN) 9    TOTAL PROTEIN 7.6    ALBUMIN 3.6    BILIRUBIN TOTAL 0.8    ALKALINE PHOSPHATASE 55    ASPARTATE AMINOTRANSFERASE 14    CREATININE 0.7    ESTIMATED GLOMERULAR FILT RATE > 60  ALANINE AMINOTRANSFERASE 19   Hold Blue Top Tube    Collection Time: 07/15/14  1:20 PM   Result Value    HOLD BLUE TOP TUBE RECEIVED IN HEMATOL   RBC Morphology    Collection Time: 07/15/14  1:20 PM   Result Value    MICROCYTIC CELLS SLIGHT    POLYCHROMASIA RARE   Type and Screen    Collection Time: 07/15/14  1:30 PM   Result Value    SAMPLE EXPIRATION DATE  07/18/2014-1330    ABO/RH INTERPRETATION  B POS    ANTIBODY SCREEN SOLID PHASE  NEGATIVE    Narrative     Pt.been pregnant/transfused in the previous 3 months?->Yes  Pt. been preg/transfused in previous 3 months?Darreld Mclean         In brief this is a 39 year old G35P3 presenting with vaginal bleeding, recent US shows an IUP two days ago. Since then, has had increased pain and bleeding. [place] ordered.    Recent UTI, finished course of abx last week. Has a WBC of 20K in the ed. Is afebrile, awaiting urinalysis results.    Ob/gyn consulted. Patient care transitioned to evening attending pending ob/gyn recommendations.      Diagnosis/Diagnoses:  Vaginal bleeding in pregnancy    Renne Musca, [place]

## 2014-07-15 NOTE — Narrator Note (Signed)
Patient Disposition    Patient education for diagnosis, medications, activity, diet and follow-up.  Patient left ED 7:33 PM.  Patient rep received written instructions.  Interpreter to provide instructions: Yes    Patient belongings with patient: YES    Have all existing LDAs been addressed? Yes    Have all IV infusions been stopped? Yes    Discharged to: Discharged to home w Rx 3 f/u w gyn 4/25

## 2014-07-15 NOTE — ED Triage Note (Signed)
Pt has hx of miscarriage.  Pt states she has been bleeding for two weeks.  Had ultrasound last week with blood work.  Pt states they wanted her to follow up due to lowering hcg levels.  Pt started cramping and bleeding more yesterday.  Vomiting today.  Pt states she thinks she is miscarrying again.

## 2014-07-15 NOTE — Narrator Note (Signed)
Pt back from ultrasound.

## 2014-07-15 NOTE — Discharge Instructions (Signed)
Miscarriage A miscarriage is the sudden loss of an unborn baby (fetus) before the 20th week of pregnancy. Most miscarriages happen in the first 3 months of pregnancy. Sometimes, it happens before a woman even knows she is pregnant. A miscarriage is also called a "spontaneous miscarriage" or "early pregnancy loss." Having a miscarriage can be an emotional experience. Talk with your caregiver about any questions you may have about miscarrying, the grieving process, and your future pregnancy plans. CAUSES   Problems with the fetal chromosomes that make it impossible for the baby to develop normally. Problems with the baby's genes or chromosomes are most often the result of errors that occur, by chance, as the embryo divides and grows. The problems are not inherited from the parents.  Infection of the cervix or uterus.   Hormone problems.   Problems with the cervix, such as having an incompetent cervix. This is when the tissue in the cervix is not strong enough to hold the pregnancy.   Problems with the uterus, such as an abnormally shaped uterus, uterine fibroids, or congenital abnormalities.   Certain medical conditions.   Smoking, drinking alcohol, or taking illegal drugs.   Trauma.  Often, the cause of a miscarriage is unknown.  SYMPTOMS   Vaginal bleeding or spotting, with or without cramps or pain.  Pain or cramping in the abdomen or lower back.  Passing fluid, tissue, or blood clots from the vagina. DIAGNOSIS  Your caregiver will perform a physical exam. You may also have an ultrasound to confirm the miscarriage. Blood or urine tests may also be ordered. TREATMENT   Sometimes, treatment is not necessary if you naturally pass all the fetal tissue that was in the uterus. If some of the fetus or placenta remains in the body (incomplete miscarriage), tissue left behind may become infected and must be removed. Usually, a dilation and curettage (D and C) procedure is performed.  During a D and C procedure, the cervix is widened (dilated) and any remaining fetal or placental tissue is gently removed from the uterus.  Antibiotic medicines are prescribed if there is an infection. Other medicines may be given to reduce the size of the uterus (contract) if there is a lot of bleeding.  If you have Rh negative blood and your baby was Rh positive, you will need a Rh immunoglobulin shot. This shot will protect any future baby from having Rh blood problems in future pregnancies. HOME CARE INSTRUCTIONS   Your caregiver may order bed rest or may allow you to continue light activity. Resume activity as directed by your caregiver.  Have someone help with home and family responsibilities during this time.   Keep track of the number of sanitary pads you use each day and how soaked (saturated) they are. Write down this information.   Do not use tampons. Do not douche or have sexual intercourse until approved by your caregiver.   Only take over-the-counter or prescription medicines for pain or discomfort as directed by your caregiver.   Do not take aspirin. Aspirin can cause bleeding.   Keep all follow-up appointments with your caregiver.   If you or your partner have problems with grieving, talk to your caregiver or seek counseling to help cope with the pregnancy loss. Allow enough time to grieve before trying to get pregnant again.  SEEK IMMEDIATE MEDICAL CARE IF:   You have severe cramps or pain in your back or abdomen.  You have a fever.  You pass large blood clots (walnut-sized   or larger) ortissue from your vagina. Save any tissue for your caregiver to inspect.   Your bleeding increases.   You have a thick, bad-smelling vaginal discharge.  You become lightheaded, weak, or you faint.   You have chills.  MAKE SURE YOU:  Understand these instructions.  Will watch your condition.  Will get help right away if you are not doing well or get  worse. Document Released: 09/06/2000 Document Revised: 07/08/2012 Document Reviewed: 05/02/2011 ExitCare Patient Information 2015 [employer]. This information is not intended to replace advice given to you by your health care provider. Make sure you discuss any questions you have with your health care provider.  

## 2014-07-15 NOTE — Narrator Note (Signed)
oob to br 

## 2014-07-15 NOTE — Consults (Signed)
- OB CONSULT NOTE -    Reason for Consult:  Patient presents with:  Vaginal Bleeding: 8 WEEKS PRG WITH VAG BLEED,VOMITING,DIZZY      Consult Requested by: Liam Graham, MD    HPI: Pt presents with 2 days vaginal bleeding, heaviest last night, passing clots associated with vomiting, felt like labor ctx. Bleeding tapered over day, but cramping persists, unable to tolerate po earlier today, hungry now. Pt recent UTI-finished abx course Friday, felt sxs were due to that, diagnosed with pregnancy 2 days ago. Denies F/C, lightheadedness, dizziness.     OB/GYN Hx: [id number] h/o c-section x3, 24wk loss c/b pyelonephritis    Inpatient Problem List:   Patient Active Problem List:     GERD (Gastroesophageal Reflux Disease)     Lipoma     Depressive Disorder, not Elsewhere Classified     Migraine without Aura     Lyme disease     Tick bites     Iron deficiency anemia     Pregnancy complicated by previous recurrent miscarriages      Past Medical History:     Past Medical History    NO SIGNIFICANT INFECTIONS     NO SIGNIFICANT MEDICAL HISTORY     Miscarriage 04/2007    Comment: with D&C; [facility]    UTI        Past Surgical History:       Past Surgical History    NO SIGNIFICANT SURGICAL HISTORY      CESAREAN BIRTH CLASS      Comment x3    ------------OTHER-------------      Comment REMOVAL OF LIPOMA ON ABDOMEN    OB ANTEPARTUM CARE CESAREAN DLVR & POSTPARTUM       Allergies:   Review of Patient's Allergies indicates:  No Known Allergies    SOCIAL HISTORY:   Tobacco Use:   Smoking status: Never Smoker    Smokeless tobacco: Never Used      Alcohol:   Alcohol Use: No   Comment: denies       Family History:   Family History    Cancer - Breast FamHxNeg     Cancer - Cervical FamHxNeg     Cancer - Colon FamHxNeg     Diabetes FamHxNeg     Heart Paternal Grandfather     Comment: during surgery for bypass    Hypertension Maternal Grandmother     Lipids FamHxNeg     Psychiatric Illness FamHxNeg     Stroke FamHxNeg     Thyroid  FamHxNeg     Cancer - Lung Maternal Grandfather        Vital Signs - Last 24 Hours:   Temp:  [98.9 F (37.2 C)] 98.9 F (37.2 C)  Pulse:  [107] 107  Resp:  [18] 18  BP: (104)/(63) 104/63 mmHg    Vital Signs - Last 8 Hours:   BP: (104)/(63)   Temp:  [98.9 F (37.2 C)]   Pulse:  [107]   Resp:  [18]   SpO2:  [100 %]     Physical Exam:   Gen: NAD  ABD: soft, ND, mildly TTP suprapubic, no guarding, rebound, rigidity  Pelvic: 8 wk size uterus, cervix closed, no CMT, nl adnexa, ovaries, scant blood on glove, scant blood on pad    Recent labs:      Lab Results  Component Value Date   NA 138 07/15/2014   K 3.6 07/15/2014   CL 104 07/15/2014   CO2 24  07/15/2014   BUN 9 07/15/2014   CREAT 0.7 07/15/2014   GLUCOSER 106 07/15/2014       Lab Results  Component Value Date   WBC 20.5* 07/15/2014   HGB 11.6 07/15/2014   HCT 34.5 07/15/2014   PLTA 279 07/15/2014     Imaging: Exam Date: 07/15/14  Exam Status: Signed      Exam: US OB 1ST TRIM W TRANSVAG, 3D    Reason for Exam: vaginal bleeding        Examination: First trimester obstetrical ultrasound with transvaginal evaluation and 3-D rendering of the uterus      INDICATION: Vaginal bleeding      EDD: 02/18/2015, last examination 07/13/2014      Examination shows no evidence of an intrauterine pregnancy. There is some echogenic material seen within the uterus question retained products of conception. There is some color Doppler blood flow associated with this echogenic material. This echogenic material appears to be within the endometrium on 3D rendering . There is a right ovarian corpus luteal cyst. There is nodes of pelvic fluid or collection.      IMPRESSION: Follow-up examination from 07/13/2014 shows no evidence of an intrauterine pregnancy.    2. There is echogenic material with some color Doppler blood flow in the area of the endometrium raising the question of retained products of conception.      <<Signature on File>>    Dictated By:                   Robie Ridge MD    Reviewed and Electronically Signed By:  Robie Ridge, MD     Impression: 35TP [id number] s/p SAB    Recommendations:   - SAB: u/s shows POC passed, recommend methergine series x24hrs, IM dose now, then q6hrs x3 doses  - Pain: Toradol x1 now, then ibuprofen for home  - UTI: send UA for culture, TOC, Rx prn  - Pt to f/u with [facility], call in am for appt, pelvic rest until eval, bleeding precautions reviewed    Charlynne Pander, [place], 07/15/2014, 7:15 PM       Pager: [phone]

## 2014-07-15 NOTE — Progress Notes (Signed)
Spoke to husband, they are waiting in ED now.

## 2014-07-16 LAB — HOLD GREEN TOP TUBE

## 2014-07-16 LAB — URINE CULTURE

## 2014-07-19 NOTE — Progress Notes (Signed)
Subjective  HPI  Pt has cough for the past one week   Productive   Denies any fever, chest pain ,   Started off as nasal congestion    Patient Active Problem List:     GERD (Gastroesophageal Reflux Disease)     Lipoma     Depressive Disorder, not Elsewhere Classified     Migraine without Aura     Lyme disease     Tick bites     Iron deficiency anemia     Pregnancy complicated by previous recurrent miscarriages        Past Surgical History    NO SIGNIFICANT SURGICAL HISTORY      CESAREAN BIRTH CLASS      Comment x2    ------------OTHER-------------      Comment REMOVAL OF LIPOMA ON ABDOMEN    OB ANTEPARTUM CARE CESAREAN DLVR & POSTPARTUM         Family History    Cancer - Breast FamHxNeg     Cancer - Cervical FamHxNeg     Cancer - Colon FamHxNeg     Diabetes FamHxNeg     Heart Paternal Grandfather     Comment: during surgery for bypass    Hypertension Maternal Grandmother     Lipids FamHxNeg     Psychiatric Illness FamHxNeg     Stroke FamHxNeg     Thyroid FamHxNeg     Cancer - Lung Maternal Grandfather        Current Outpatient Prescriptions:  ibuprofen (ADVIL,MOTRIN) 800 MG tablet Take 1 tablet by mouth every 8 (eight) hours as needed for Pain. pain Disp: 20 tablet Rfl: 0   ondansetron (ZOFRAN) 4 MG tablet Take 1 tablet by mouth every 6 (six) hours as needed for Nausea. for nausea and vomitting Disp: 15 tablet Rfl: 0   guaiFENesin-codeine (CHERATUSSIN AC) 100-10 MG/5ML liquid Take 5 mLs by mouth 3 (three) times daily as needed for Cough. Disp: 120 mL Rfl: 0   albuterol (PROAIR HFA) 108 (90 BASE) MCG/ACT inhaler Inhale 2 puffs into the lungs every 6 (six) hours as needed for Wheezing or Shortness of breath. Disp: 1 Inhaler Rfl: 0     No current facility-administered medications for this visit.  Review of Patient's Allergies indicates:  No Known Allergies  ROS         Objective    BP 110/70 mmHg   Pulse 85   Temp(Src) 97.5 F (36.4 C) (Temporal)   Resp 18   Ht 5\' 1"  (1.549 m)   Wt 76.658 kg (169 lb)   BMI 31.95 kg/m2    SpO2 98%   LMP 06/04/2014  Physical Exam   Neck: Normal range of motion. Neck supple.   Pulmonary/Chest: Effort normal. She has wheezes.              (J20.8) Viral bronchitis  (primary encounter diagnosis)  Comment:   Plan: guaiFENesin-codeine (CHERATUSSIN AC) 100-10         MG/5ML liquid, albuterol (PROAIR HFA) 108 (90         BASE) MCG/ACT inhaler, DISCONTINUED: albuterol         (PROAIR HFA) 108 (90 BASE) MCG/ACT inhaler          We discussed the importance of medication compliance. The patient was ready to learn and no apparent learning barriers were identified. I explained the diagnosis and treatment plan, and the patient expressed understanding of the content. Possible side effects of the prescribed medication(s) were explained, .  I  attempted to answer any questions regarding the diagnosis and the proposed treatment.    We discussed the patients current medications. The patient expressed understanding and no barriers to adherence were identified.      Shaune Pascal MD

## 2014-07-20 ENCOUNTER — Ambulatory Visit (HOSPITAL_BASED_OUTPATIENT_CLINIC_OR_DEPARTMENT_OTHER): Payer: No Typology Code available for payment source | Admitting: Advanced Practice Midwife

## 2014-07-22 ENCOUNTER — Telehealth (HOSPITAL_BASED_OUTPATIENT_CLINIC_OR_DEPARTMENT_OTHER): Payer: Self-pay | Admitting: Internal Medicine

## 2014-07-22 ENCOUNTER — Ambulatory Visit (HOSPITAL_BASED_OUTPATIENT_CLINIC_OR_DEPARTMENT_OTHER): Payer: No Typology Code available for payment source | Admitting: Internal Medicine

## 2014-07-22 NOTE — Telephone Encounter (Signed)
-----   Message from Bettey Costa sent at 07/22/2014  3:28 PM EDT -----  Regarding: FW: s/p SAB      ----- Message -----     From: Caryl Comes     Sent: 07/22/2014  11:44 AM       To: Ec Support Staff Pool  Subject: FW: s/p SAB                                      Pt needs to be rescheduled with Haywood Lasso and with me.  ----- Message -----     From: Caryl Comes     Sent: 07/22/2014       To: Caryl Comes  Subject: FW: s/p SAB                                          ----- Message -----     From: Haywood Lasso     Sent: 07/21/2014   9:41 AM       To: Caryl Comes  Subject: s/p SAB                                          Sural,  It looks like Gayanne in the end did have a miscarriage. She did not show up for visit with me. I see she is schedule with you. Please advise her she need to follow up with weekly quants till zero.  That is generally what we do to confirm full passage of Products of conception/ POC. Also as you see in the ER visit ultrasound ? Retained POC but not very sensitive tool. Generally if she is feeling well and quants are falling that is all that needs to be done. Although if rising I would consult with OB for plan. ML

## 2014-07-22 NOTE — Progress Notes (Signed)
.  returned patient call. unable to reach patient. left a message to call us back. appt with marilou was reschedule for Monday 07/27/14 at 1:00 pm.

## 2014-07-27 ENCOUNTER — Telehealth (HOSPITAL_BASED_OUTPATIENT_CLINIC_OR_DEPARTMENT_OTHER): Payer: Self-pay

## 2014-07-27 ENCOUNTER — Ambulatory Visit (HOSPITAL_BASED_OUTPATIENT_CLINIC_OR_DEPARTMENT_OTHER): Payer: No Typology Code available for payment source | Admitting: Advanced Practice Midwife

## 2014-07-27 NOTE — Progress Notes (Signed)
dnka appointment today with Haywood Lasso.

## 2014-08-10 ENCOUNTER — Telehealth (HOSPITAL_BASED_OUTPATIENT_CLINIC_OR_DEPARTMENT_OTHER): Payer: Self-pay | Admitting: Registered Nurse

## 2014-08-10 NOTE — Telephone Encounter (Signed)
-----   Message from Wyatt sent at 08/10/2014 12:29 PM EDT -----  Regarding: # to fax letter to      Claudia Lawrence [id number], 39 year old, female, Telephone Information:  Home Phone      [phone]  Work Phone      [phone]  Mobile          [phone]      Patient's Preferred Pharmacy:     [id number] [place], [place]. [facility]  Phone: [phone] Fax: [phone]    [facility]  Phone: [phone] Fax: [phone]      CONFIRMED TODAY: Yes    Patient's language of care: [place]    Patient does not need an interpreter.    Patient's PCP: Shah,Sural MD    Person calling on behalf of patient: Patient (self)    Calls today pt. States that the # to fax letter to is [phone]

## 2014-08-10 NOTE — Progress Notes (Signed)
Call to pt.  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  Asking that they state reason.   Will call me back with number to fax back to.

## 2014-08-10 NOTE — Telephone Encounter (Signed)
-----   Message from [place] sent at 08/10/2014 11:58 AM EDT -----  Regarding: Personal  Contact: [phone]      Ignacia Gentzler [id number], 39 year old, female, Telephone Information:  Home Phone      [phone]  Work Phone      [phone]  Mobile          [phone]      Patient's Preferred Pharmacy:     [id number] [place], [place]. AT [facility]  Phone: [phone] Fax: [phone]    [facility] [facility] ([facility]  Phone: [phone] Fax: [phone]      CONFIRMED TODAY: Christene Lye NUMBER: [phone]  Best time to call back:   Cell phone:   Other phone:    Available times:    Patient's language of care: [place]    Patient does not need an interpreter.    Patient's PCP: Shah,Sural MD    Person calling on behalf of patient: Patient (self)    Calls today to speak to nurse only.

## 2014-08-10 NOTE — Progress Notes (Signed)
Out of work letter faxed to the number as below.

## 2015-04-06 ENCOUNTER — Ambulatory Visit (HOSPITAL_BASED_OUTPATIENT_CLINIC_OR_DEPARTMENT_OTHER): Payer: No Typology Code available for payment source | Admitting: Physician Assistant

## 2015-04-06 ENCOUNTER — Encounter (HOSPITAL_BASED_OUTPATIENT_CLINIC_OR_DEPARTMENT_OTHER): Payer: Self-pay | Admitting: Physician Assistant

## 2015-04-06 VITALS — BP 100/60 | HR 81 | Temp 98.6°F | Wt 174.0 lb

## 2015-04-06 DIAGNOSIS — Z124 Encounter for screening for malignant neoplasm of cervix: Secondary | ICD-10-CM

## 2015-04-06 DIAGNOSIS — Z0000 Encounter for general adult medical examination without abnormal findings: Principal | ICD-10-CM

## 2015-04-06 DIAGNOSIS — Z862 Personal history of diseases of the blood and blood-forming organs and certain disorders involving the immune mechanism: Secondary | ICD-10-CM

## 2015-04-06 DIAGNOSIS — G43009 Migraine without aura, not intractable, without status migrainosus: Secondary | ICD-10-CM

## 2015-04-06 LAB — CBC, PLATELET & DIFFERENTIAL
ABSOLUTE BASO COUNT: 0 10*3/uL (ref 0.0–0.1)
ABSOLUTE EOSINOPHIL COUNT: 0.1 10*3/uL (ref 0.0–0.8)
ABSOLUTE IMM GRAN COUNT: 0.02 10*3/uL (ref 0.00–0.03)
ABSOLUTE LYMPH COUNT: 1.6 10*3/uL (ref 0.6–5.9)
ABSOLUTE MONO COUNT: 0.6 10*3/uL (ref 0.2–1.4)
ABSOLUTE NEUTROPHIL COUNT: 4.9 10*3/uL (ref 1.6–8.3)
BASOPHIL %: 0.1 % (ref 0.0–1.2)
EOSINOPHIL %: 1.5 % (ref 0.0–7.0)
HEMATOCRIT: 38.2 % (ref 34.1–44.9)
HEMOGLOBIN: 12.2 g/dL (ref 11.2–15.7)
IMMATURE GRANULOCYTE %: 0.3 % (ref 0.0–0.4)
LYMPHOCYTE %: 22.5 % (ref 15.0–54.0)
MEAN CORP HGB CONC: 31.9 g/dL (ref 31.0–37.0)
MEAN CORPUSCULAR HGB: 25 pg — ABNORMAL LOW (ref 26.0–34.0)
MEAN CORPUSCULAR VOL: 78.3 fL — ABNORMAL LOW (ref 80.0–100.0)
MEAN PLATELET VOLUME: 10.7 fL (ref 8.7–12.5)
MONOCYTE %: 8 % (ref 4.0–13.0)
NEUTROPHIL %: 67.6 % (ref 40.0–75.0)
PLATELET COUNT: 426 10*3/uL — ABNORMAL HIGH (ref 150–400)
RBC DISTRIBUTION WIDTH STD DEV: 41.5 fL (ref 35.1–46.3)
RBC DISTRIBUTION WIDTH: 14.7 % — ABNORMAL HIGH (ref 11.5–14.3)
RED BLOOD CELL COUNT: 4.88 M/uL (ref 3.90–5.20)
WHITE BLOOD CELL COUNT: 7.2 10*3/uL (ref 4.0–11.0)

## 2015-04-06 NOTE — Progress Notes (Signed)
Review of Systems   Constitutional: Negative for chills, fever and malaise/fatigue.   HENT: Negative for hearing loss and tinnitus.    Eyes: Negative for blurred vision, double vision, photophobia and pain.   Respiratory: Negative for cough and shortness of breath.    Cardiovascular: Negative for chest pain, palpitations and leg swelling.   Gastrointestinal: Negative for abdominal pain, blood in stool, constipation, diarrhea, heartburn, melena, nausea and vomiting.   Genitourinary: Negative for dysuria and hematuria.   Musculoskeletal: Positive for neck pain. Negative for back pain and myalgias.   Skin: Negative for rash.   Neurological: Positive for headaches. Negative for dizziness, tingling, tremors and weakness.   Psychiatric/Behavioral: Negative for depression. The patient is not nervous/anxious.             Subjective     Claudia Lawrence is a 40 year old female presents for annual exam.     Health Maintenance   Preventative Screening   -Physical Exam: 04/06/15   -PAP, Pelvic, breast exam: 04/06/15   -Mammogram: NO family hx. To start  Screening 40-50   -Bone Density: to start 65   -Colonoscopy: No family hx. To start age 33       Cardiac Testing   -ECG: 01/16/2011, normal   -Stress Test: NI     Immune Status:  Immunization History   Administered Date(s) Administered    Influenza Virus Quadrivalent Vacc 3/> Yrs Im 01/16/2014    Influenza Virus Tri Presv Free 3/> YRS IM 01/01/2009    Penicilin Benzathine (Bicillin) 10/01/2008, 10/15/2008, 10/22/2008    Td 07/02/2007        Reports a lot of headaches, not a new issue and not changed from baseline. H/o CT scan 2012, was normal. Describes as bifrontal "squeezing" sensation, sometimes unilat with throbbing, sometimes with nausea, photo/phonosensitivity. No aura, dizziness, facial numbness/tingling, visual changes, tinnitus.  Occurs about once monthly, not related to menses. Possibly related to stress. Takes ibuprofen, excedrin migraine. Sometimes gets neck mm  tightness before headache onset    Going to school at [employer], will be graduating May, plans to work as Licensed conveyancer. Needs proof of immunizations.  Doesn't have vaccine record from [place]. Needs proof of measels, mumps, rubella, TD, hep b x 3 and varicella.  Refusing flu shot today.     H/o depression, controlled, last active episode was 10 yrs ago. Reports mood has been good and stable, no on meds. No SI/HI.     H/o iron def anemia in pregnancy, thinks better. Not on iron supplement. Eats red meat, some leafy green vegetables.     Patient Active Problem List:     GERD (gastroesophageal reflux disease)     Lipoma     Depressive disorder, not elsewhere classified     Migraine without aura     Lyme disease     Tick bites     Iron deficiency anemia     Pregnancy complicated by previous recurrent miscarriages      Current Outpatient Prescriptions:  guaiFENesin-codeine (CHERATUSSIN AC) 100-10 MG/5ML liquid Take 5 mLs by mouth 3 (three) times daily as needed for Cough. Disp: 120 mL Rfl: 0   albuterol (PROAIR HFA) 108 (90 BASE) MCG/ACT inhaler Inhale 2 puffs into the lungs every 6 (six) hours as needed for Wheezing or Shortness of breath. Disp: 1 Inhaler Rfl: 0     No current facility-administered medications for this visit.   Review of Patient's Allergies indicates:  No Known Allergies  Social  History    Marital status: Married             Spouse name: Perlean Flud          Years of education: college         Number of children: 2             Occupational History  Occupation          Employer            Comment               [place] History Main Topics    Smoking status: Never Smoker                                                                Smokeless status: Never Used                        Alcohol use: No                 Comment: denies    Drug use: No                 Comment: denies IVDU    Sexual activity:  Yes               Partners with: Female       Birth control/protection: Pill       Comment: SA with husband, no condoms on OCPs,                 denies STIs    Other Topics            Concern  Military Service        No  Blood Transfusions      No  Caffeine Concern        No  Occupational Exposure   No  Hobby Hazards           No  Sleep Concern           No  Stress Concern          No  Weight Concern          No  Special Diet            No  Back Care               No  Exercise                No  Seat Belt               No    Comment:100%    Social History Narrative    Living in [place] with husband 3 kids - 77yo, 72yo, 38 yo    Married since 1997    Denies DV, Feels safe in neighborhood, no guns in home. Pets: 1 small dog    Originally from [place]     Moved  to [place] in 2005    Going to school, mental health counselor    Completed college, psychology        Diet - balanced, low sugar, protein, red meat 2-3 times weekly.  Caffeine - 2c/day. No etoh    Exercise - none now        LOW HEP B risk    -----pt non-asian    -----denies H/O multiple sex partners; lifetime partners =    -----never treated for STI    -----denies IVDU    -----denies being born to mom with HEP B    -----does not work in Tax adviser    -----without body piercings/tattoos        Past Surgical History    NO SIGNIFICANT SURGICAL HISTORY      CESARE[employer] BIRTH CLASS      Comment x2    ------------OTHER-------------      Comment REMOVAL OF LIPOMA ON ABDOMEN    OB ANTEPARTUM CARE CESAREAN DLVR & POSTPARTUM         Family History    OTHER Sister     Comment: epilepsy    Cancer - Breast FamHxNeg     Cancer - Cervical FamHxNeg     Cancer - Colon FamHxNeg     Diabetes FamHxNeg     Heart Paternal Grandfather     Comment: during surgery for bypass    Hypertension Maternal Grandmother     Lipids FamHxNeg     Psychiatric Illness FamHxNeg     Stroke FamHxNeg     Thyroid FamHxNeg     Cancer - Lung Maternal Grandfather     No Known Problems Mother     No Known  Problems Father             Objective     All ROS reviewed and discussed and are otherwise negative unless listed above.         PHYSICAL EXAM:    GENERAL APPEARANCE -  A&Ox3, WDWN, NAD  HEENT - head normocephalic, atraumatic, EOMI, PERRLA, conjunctiva clear bilaterally, no occular discharge. TM bilaterally gray and translucent, without bulging, erythema or exudate. Light reflex visible bilaterally. Nares patent. Nasal mucosa without erythema or visible mass. Pharynx without erythema, exudate or mass.    NECK - Neck soft, supple. No ant/posterior cervical or supraclavicular LAD .  Thyroid ~10g with normal consistency, no palpable nodules. No tracheal deviation  BREAST - dense tissue upper outter quadrants bilat, otherwise with normal consistency without palpable masses or tenderness bilat. No nipple discharge or dimpling. No skin color changes or axillary lymphadenopathy  LUNGS - Lungs CTATB without WRR. Normal inspiratory effort.    CARDIOVASCULAR -  RRR without S3, S4. No murmurs, clicks, gallops or rubs.  ABDOMEN - Abdomen soft, NTND. BS normal. No masses, no hepatosplenomegaly.  GU - no suprapubic TTP or inguinal LAD.  Labia without masses or lesions.  Vaginal mucosa without erythema, edema or mass. Cervix minimally friable without masses or cervical motion tenderness.  No adnexal TTP.  Uterus small without TTP  Back - NTTP along spinous processes or paraspinal mm.  No CVA tenderness to percussion  Extrem - no tremor or edema. DTRs + 2 and equal UE and LE bilat. Pulses normal and equal bilat at dorsalis pedis and posterior tibial arteries  SKIN - skin color, texture, temperature, and turgor are normal in the exposed areas without rash or concerning lesions  Neuro- CN II-XII normal and equal bilat.  Gait and station  steady. Muscle tone and strength normal and equal bilat.   Psych -  Appears stated age, well groomed. Behavior calm and appropriate. Mood and affect appropriate. Appears to have good judgement and  insight.     ASSESSMENT/PLAN:  1. Routine general medical examination at a health care facility  Pap done, otherwise pt UTD with screening. Will check titers for school vaccine record request, update labs. Counseled re: diet, exercise, weight loss, stress relief therapies.     - CBC+Plt with Diff  - Iron  - Cholesterol  - High Density Lipoprotein  - Low Density Lipoprotein, Direct  - Cytopath, C/V, Thin Layer [id number]  - OBTAINING SCREEN PAP SMEAR  - Human Papillomavirus (HPV) [87621.2]  - Rubella  - Rubeola IgG Antibody  - Mumps IgG Antibody  - Varicella Zoster IgG Antibody  - Hepatitis B Surface Antibody  - Hepatitis B Surface Antigen    2. Screening for cervical cancer  - Cytopath, C/V, Thin Layer [id number]  - OBTAINING SCREEN PAP SMEAR  - Human Papillomavirus (HPV) [87621.2]    3. H/O iron deficiency anemia  Suspect improved, will confirm with labs. If anemic or iron-deficient will supplement as appropriate  - CBC+Plt with Diff  - Iron    4. Migraine without aura and without status migrainosus, not intractable  Per baseline.  Controlled with otc meds.  Per hx seems some of her HAs could be tension headaches. Long discussion re: treatment and prevention. Advised massage, heat, stretches, stress-relief therapies.  May consider PT if not improving and pt getting HAs more consistent with tension than with migraine. Keep symptom journal.       I have reviewed the past medical, surgical, social and family history and updated these sections of EpicCare as relevant. All interim labs, test results, and consult notes were reviewed and discussed with patient. Medications were reconciled during this visit and a current medication list was given to the patient at the end of the visit.      follow up 1-2 years, prn    Ernie Avena, PA-C

## 2015-04-06 NOTE — Patient Instructions (Signed)
Index Spanish version Related Topics   Tension Headache   What is a tension headache?   A tension headache is a headache caused by tense muscles in your face, neck, or scalp. It is also sometimes called a muscle-contraction headache. Tension headaches are very common.  How does it occur?   The muscles of your face, neck, and scalp may become tense because of:  · anxiety or stress   · staying in one position for a long time   · injury--for example, from a car accident   · depression   Headaches can also be triggered by:  · having too little or too much sleep   · eating too little or too much   · drinking too much alcohol or going through alcohol withdrawal   · being somewhere that is too noisy   · working hard   · some medical conditions   What are the symptoms?   The symptoms may be:  · a feeling like a tight band is around your head   · an ache or dull and steady pain felt at the temples or affecting the whole head that worsens through the day, sometimes with a sore or stiff neck   · trouble concentrating   · trouble sleeping   · pain that starts or gets worse with stress, tiredness, noise, or glare   Your muscles might twitch or spasm. Sometimes your head may feel like it is throbbing.  How is it diagnosed?   Your healthcare provider will ask about your symptoms and examine you. No single test can confirm that a headache is a tension headache. The diagnosis is based on your symptoms, medical history, and a physical exam.  Your healthcare provider may ask:  · When did the headache start?   · How bad is it?   · Where is the pain located?   · What kind of pain is it? Is it sharp, burning, or throbbing?   · Do you have other symptoms, such as nerve tingling or weakness?   · Do you have a fever?   · Do you feel sick or vomit?   · Do you have eye pain or vision changes?   · Did you have an accident or injury before the pain started?   · Did you take any drugs before the pain started?   · Have you had other headaches  like this one?   · What stresses are you having?   · What is your family history for headaches?   Sometimes it can be hard for you to know if a headache is a tension headache or a mild migraine headache.  How is it treated?   You can reduce muscle tightness and relieve pain with:  · nonprescription pain medicine, such as acetaminophen or ibuprofen   · relaxation exercises   · warm bath or hot shower   · massage   · regular physical exercise.   If the pain continues, your healthcare provider might:  · Refer you for physical therapy.   · Recommend biofeedback therapy (use of a machine to help you learn to control muscle tension).   · Prescribe a stronger pain reliever.   How long will the effects last?   Symptoms usually last a few hours to a day.  Taking pain medicine too often for headaches can cause headaches. These headaches are called rebound headaches or drug-induced headaches. It can create a bad cycle: You have a headache, so   you take pain medicine. When the pain medicine wears off it causes another headache, which causes you to take more medicine, which causes another headache.  You are at risk for rebound headaches if you take pain medicine 3 or more days a week. Examples of nonprescription medicines that can cause rebound headaches are aspirin, acetaminophen, and ibuprofen. Some prescribed pain medicines can also cause this problem. Talk to your healthcare provider if you are taking medicine for headaches more often than 2 or 3 times a week.  How can I take care of myself?   · Rest in a quiet, dark room until symptoms lessen or go away.   · Take a pain reliever such as aspirin, acetaminophen, ibuprofen, or other medicine your healthcare provider recommends or prescribes. Do this as soon as you notice symptoms. Recognizing early warning signs of headache and starting treatment right away is crucial to having less pain.   ¨ Check with your healthcare provider before you give any medicine that contains aspirin  or salicylates to a child or teen. This includes medicines like baby aspirin, some cold medicines, and Pepto-Bismol. Children and teens who take aspirin are at risk for a serious illness called Reye's syndrome.   ¨ Nonsteroidal anti-inflammatory medicines (NSAIDs), such as ibuprofen, naproxen, and aspirin, may cause stomach bleeding and other problems. These risks increase with age. Read the label and take as directed. Unless recommended by your healthcare provider, do not take for more than 10 days for any reason.   · Stretch and massage your neck, shoulders, and back. Put heat, an ice pack, or a cold washcloth on these areas.   · See your healthcare provider right away if:  ¨ You have much more pain than your usual headaches and it does not go away.  ¨ You have repeated vomiting.  ¨ You have numbness or tingling in your face, arms, or legs.  ¨ Your arms or legs feel weak.  ¨ You have trouble seeing, thinking, talking, or walking.  What can be done to help prevent tension headaches?   · Try to identify and avoid situations that cause tension or stress. Consider getting counseling to help you reduce the stress in your life.   · Take breaks from tasks and learn to use relaxation techniques.   · Exercise regularly.   · Get enough sleep.   · Try not to push yourself too hard.   · Eat meals regularly.   · Do not smoke.   · Do not drink a lot of alcohol.   · Keep your sense of humor. This reduces tension.   You can get more information from:  · American Council for Headache Education (ACHE)  Phone: 800-255-ACHE (255-2243)  Web site: http://www.achenet.org  Educational materials, referrals to support groups   · National Headache Foundation  Phone: 800-843-2256   Web site: http://www.headaches.org  Educational materials, list of headache specialists, information specialists   Developed by [employer].  Adult Advisor 2012.1 published by [employer].  Last modified: 2009-11-08  Last reviewed: 2009-07-27  This content is  reviewed periodically and is subject to change as new health information becomes available. The information is intended to inform and educate and is not a replacement for medical evaluation, advice, diagnosis or treatment by a healthcare professional.  References   Adult Advisor 2012.1 Index   © 2012 [employer] and/or its affiliates. All rights reserved.

## 2015-04-08 LAB — HUMAN PAPILLOMAVIRUS (HPV): HUMAN PAPILLOMAVIRUS: NEGATIVE

## 2015-04-08 LAB — CYTOPATH, C/V, THIN LAYER

## 2015-04-09 ENCOUNTER — Encounter (HOSPITAL_BASED_OUTPATIENT_CLINIC_OR_DEPARTMENT_OTHER): Payer: Self-pay | Admitting: Physician Assistant

## 2015-04-09 LAB — HEPATITIS B SURFACE ANTIBODY: HEPATITIS B SURFACE ANTIBODY: NONREACTIVE

## 2015-04-09 LAB — CHOLESTEROL: Cholesterol: 183 mg/dL (ref 0–239)

## 2015-04-09 LAB — LOW DENSITY LIPOPROTEIN DIRECT: LOW DENSITY LIPOPROTEIN DIRECT: 112 mg/dL (ref 0–189)

## 2015-04-09 LAB — IRON: IRON: 54 ug/dL (ref 50–170)

## 2015-04-09 LAB — HEPATITIS B SURFACE ANTIGEN: HEPATITIS B SURFACE ANTIGEN: NONREACTIVE

## 2015-04-09 LAB — RUBELLA IGG ANTIBODY: RUBELLA: 492.7 IU/mL

## 2015-04-09 LAB — RUBEOLA IGG ANTIBODY: RUBEOLA IGG ANTIBODY: >4.5 INDEX

## 2015-04-09 LAB — HIGH DENSITY LIPOPROTEIN: HIGH DENSITY LIPOPROTEIN: 63 mg/dL (ref 40–?)

## 2015-04-09 LAB — MUMPS IGG ANTIBODY: MUMPS IGG ANTIBODY: >4.5 INDEX

## 2015-04-09 LAB — VARICELLA ZOSTER IGG ANTIBODY: VARICELLA ZOSTER IGG ANTIBODY: 3.1 INDEX

## 2015-04-13 ENCOUNTER — Ambulatory Visit (HOSPITAL_BASED_OUTPATIENT_CLINIC_OR_DEPARTMENT_OTHER): Payer: No Typology Code available for payment source | Admitting: Registered Nurse

## 2015-04-13 ENCOUNTER — Encounter (HOSPITAL_BASED_OUTPATIENT_CLINIC_OR_DEPARTMENT_OTHER): Payer: Self-pay | Admitting: Physician Assistant

## 2015-04-13 VITALS — Temp 97.8°F

## 2015-04-13 DIAGNOSIS — Z23 Encounter for immunization: Principal | ICD-10-CM

## 2015-04-13 NOTE — Progress Notes (Signed)
02/01/2016  VIS given prior to administration and reviewed with the patient and or legal guardian. Patient understands the disease and the vaccine. See immunization/Injection module or chart review for date of publication and additional information.  Harce Volden F Rodriges, LPN

## 2015-04-21 ENCOUNTER — Ambulatory Visit (HOSPITAL_BASED_OUTPATIENT_CLINIC_OR_DEPARTMENT_OTHER): Payer: No Typology Code available for payment source | Admitting: Registered Nurse

## 2015-04-21 ENCOUNTER — Telehealth (HOSPITAL_BASED_OUTPATIENT_CLINIC_OR_DEPARTMENT_OTHER): Payer: Self-pay | Admitting: Internal Medicine

## 2015-04-21 DIAGNOSIS — Z23 Encounter for immunization: Principal | ICD-10-CM

## 2015-04-21 NOTE — Progress Notes (Unsigned)
Maria from [facility] called the [facility] to complete a benefit analysis for TDAP.    The vaccine is covered under the patient's prescription coverage.    Please choose 90715.2 (Prior Auth/Pharmacy) and notify [facility] via cc'd chart once the vaccine has been administered.

## 2015-04-21 NOTE — Progress Notes (Signed)
04/21/2015  Here for school form.  Oversite with her Tdap. Was given Td in 2009, but school requiring a more recent Tdap.  Given today without incident.  VIS given prior to administration and reviewed with the patient and or legal guardian. Patient understands the disease and the vaccine. See immunization/Injection module or chart review for date of publication and additional information.  Excell Seltzer, RN

## 2015-05-05 ENCOUNTER — Ambulatory Visit (HOSPITAL_BASED_OUTPATIENT_CLINIC_OR_DEPARTMENT_OTHER): Payer: No Typology Code available for payment source | Admitting: Internal Medicine

## 2021-03-07 IMAGING — MR JOELHO^DIREITO
6 of 8 series · 36 of 40 positions shown · non-contrast
Comparison: none

[Series 3: axial_dp_fs · axial · right · 3.5mm · 0.56mm/px · z∈[-62,+34]mm · 7 of 24 slices shown]
[im 1/24]
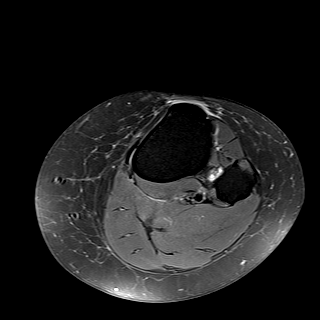
[im 4/24]
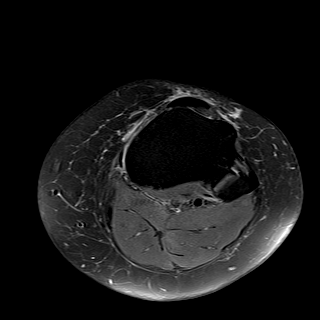
[im 8/24]
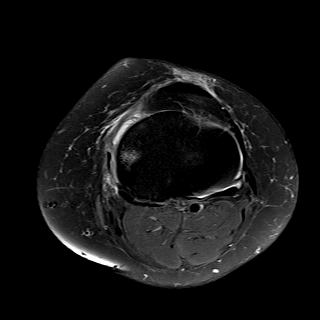
[im 12/24]
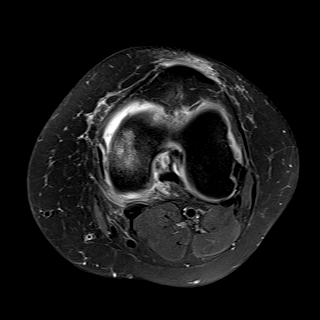
[im 16/24]
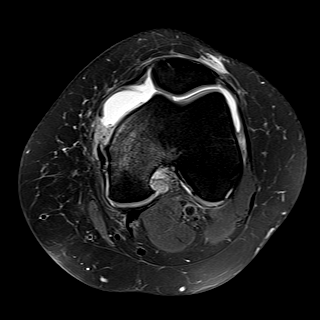
[im 20/24]
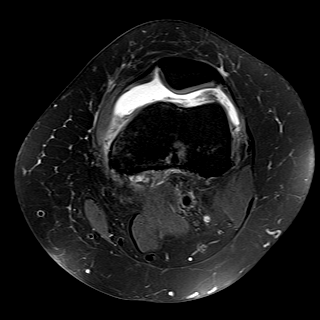
[im 24/24]
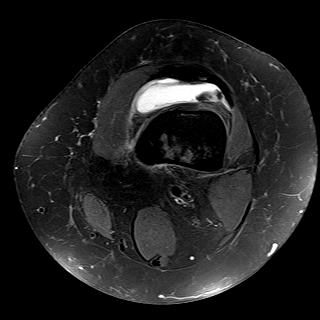

[Series 4: coronal_dp_fs · coronal · right · 3.3mm · 0.50mm/px · 6 of 20 slices shown]
[im 1/20]
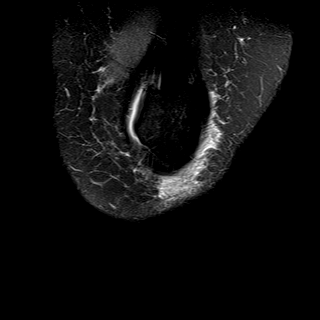
[im 4/20]
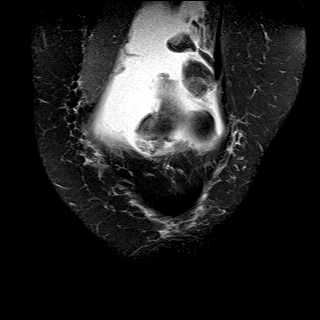
[im 8/20]
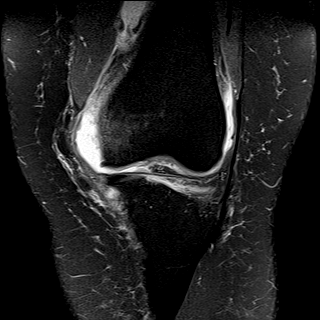
[im 12/20]
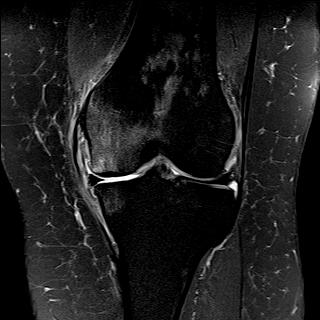
[im 16/20]
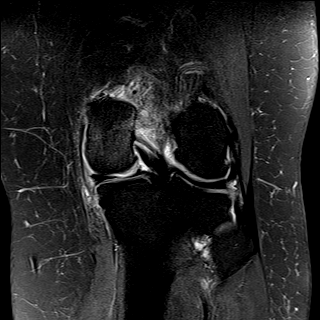
[im 20/20]
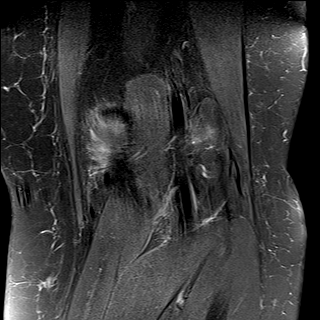

[Series 5: coronal_t1 · coronal · right · 3.3mm · 0.42mm/px · 6 of 20 slices shown]
[im 1/20]
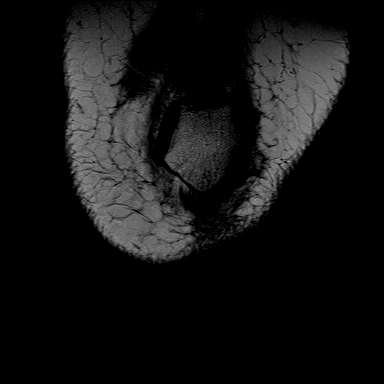
[im 4/20]
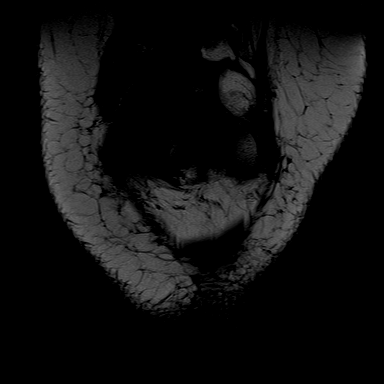
[im 8/20]
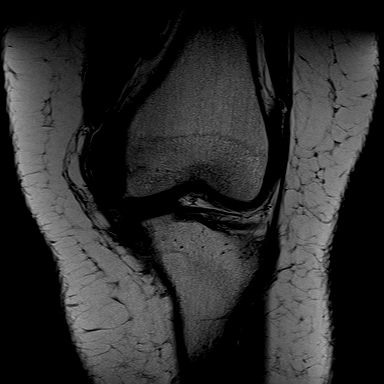
[im 12/20]
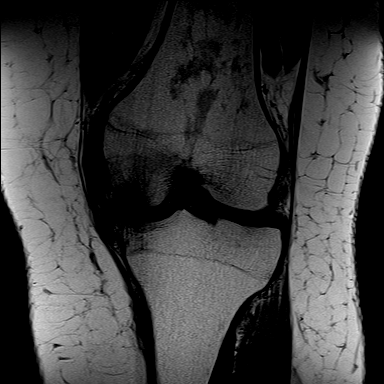
[im 16/20]
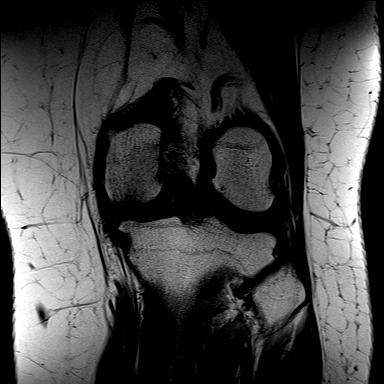
[im 20/20]
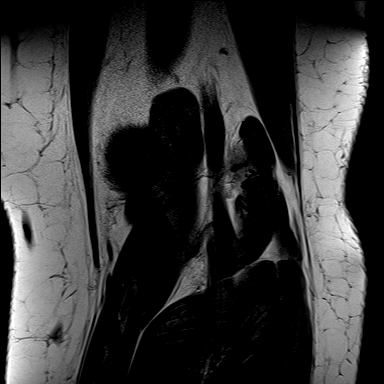

[Series 6: sagital_dp_fs · sagittal · right · 3.0mm · 0.50mm/px · 7 of 25 slices shown]
[im 1/25]
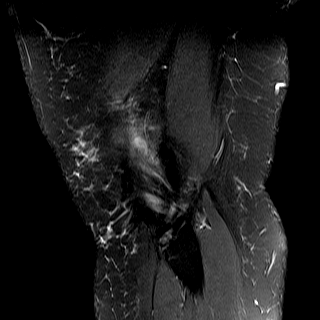
[im 5/25]
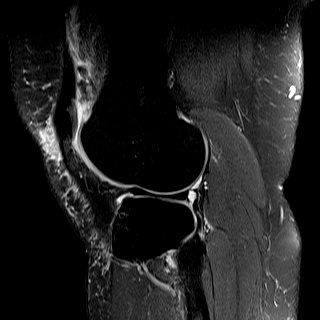
[im 9/25]
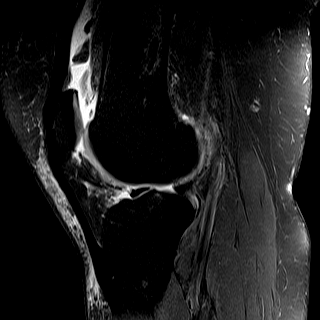
[im 13/25]
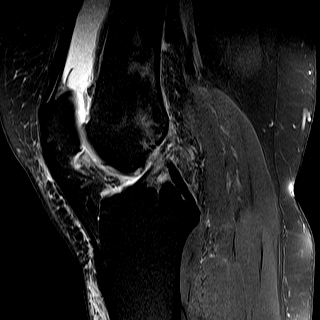
[im 17/25]
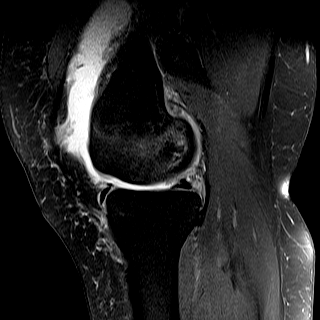
[im 21/25]
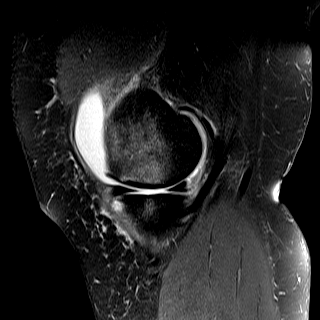
[im 25/25]
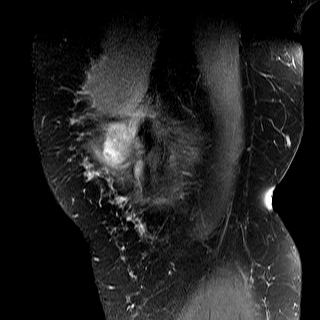

[Series 7: t2_tse_sag · sagittal · right · 3.0mm · 0.62mm/px · 7 of 25 slices shown]
[im 1/25]
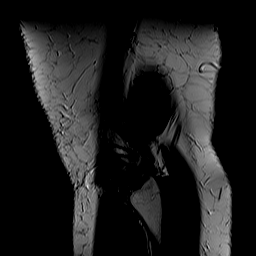
[im 5/25]
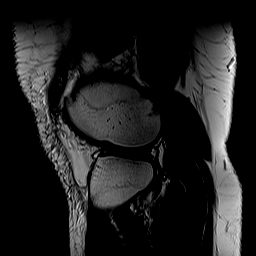
[im 9/25]
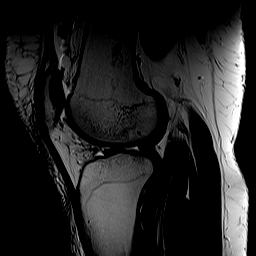
[im 13/25]
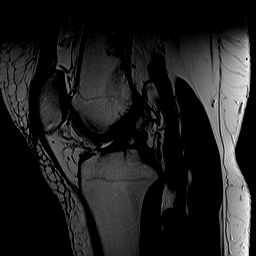
[im 17/25]
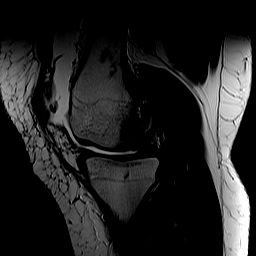
[im 21/25]
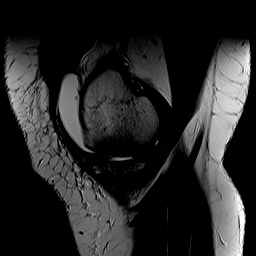
[im 25/25]
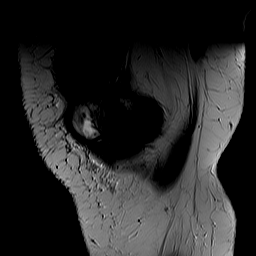

[Series 8: obliquo_t2_lca · coronal · right · 2.0mm · 0.44mm/px · 3 of 12 slices shown]
[im 1/12]
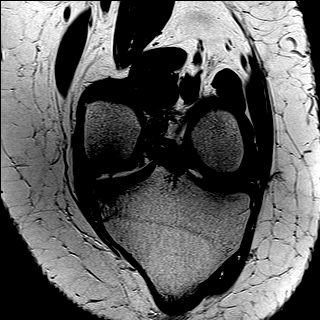
[im 6/12]
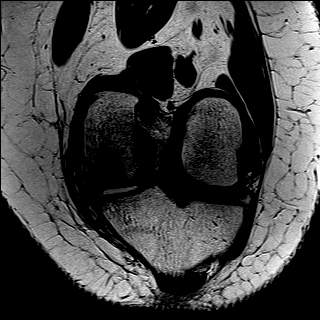
[im 12/12]
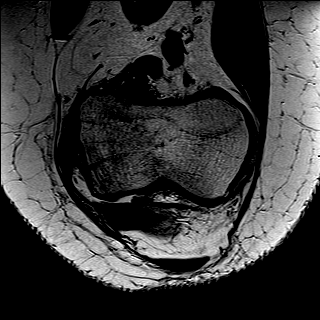

[36 of 40 positions shown; findings below may reference images not displayed]

RESSONÂNCIA MAGNÉTICA DO JOELHO ESQUERDO 
Técnica: 
Sequências multiplanares ponderadas em T1, T2 e DP com supressão de gordura. 
Análise:
Discretos osteófitos marginais no fêmur, tíbia e patela.
Redução da fenda articular fêmoro-tibial medial, com afilamento irregular das cartilagens e lesões
osteocondrais circundadas por edema nas superfícies apostas, mais acentuado no côndilo femoral onde
a possibilidade de osteonecrose inicial deve ser considerada.
Há também, afilamento irregular das cartilagens da fenda fêmoro-patelar na face medial.
Restante da estrutura óssea com intensidade de sinal normal.
Menisco interno parcialmente extruso com discreto sinal irregular no ápice do corno posterior, que se
estende as superfícies, podendo corresponder a pequena lesão.
Menisco externo de morfologia e sinal normais.
Ligamentos cruzados e colaterais íntegros.
Tendão do quadríceps e patelar com intensidade de sinal normal.
Moderado derrame articular, associado a espessamento da membrana sinovial em correspondência,
indicativo de sinovite.
Fossa poplítea sem alterações.
Leve edema no tecido celular subcutâneo da face anterior do joelho.

## 2021-11-19 IMAGING — MR JOELHO  DIREITO
7 of 9 series · 37 of 40 positions shown · non-contrast
Comparison: none

[Series 5: STIR · axial · 4.4mm · 1.04mm/px · z∈[-50,+54]mm · 6 of 20 slices shown (1 of 3)]
[im 1/20]
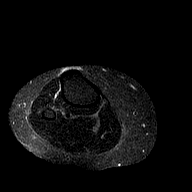
[im 4/20]
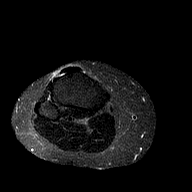
[im 8/20]
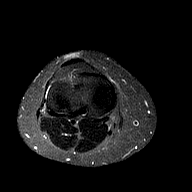
[im 12/20]
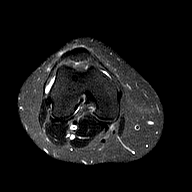
[im 16/20]
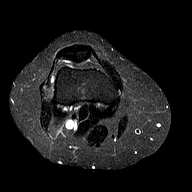
[im 20/20]
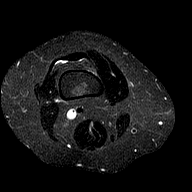

[Series 6: T1 · coronal · 4.0mm · 0.90mm/px · 6 of 20 slices shown]
[im 1/20]
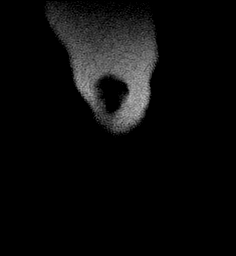
[im 4/20]
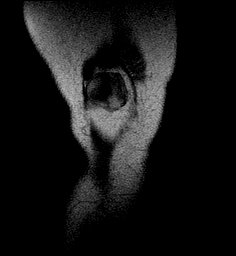
[im 8/20]
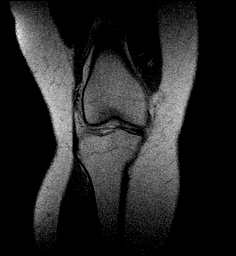
[im 12/20]
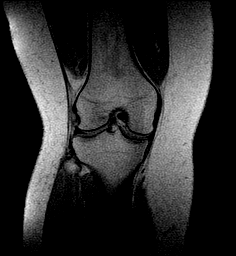
[im 16/20]
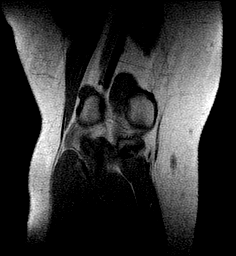
[im 20/20]
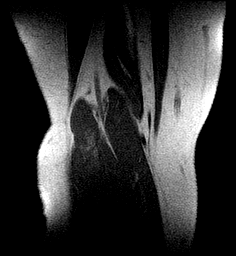

[Series 7: STIR · coronal · 4.0mm · 0.90mm/px · 6 of 20 slices shown (2 of 3)]
[im 1/20]
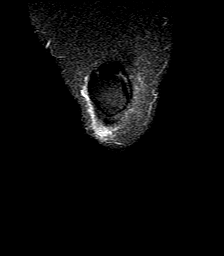
[im 4/20]
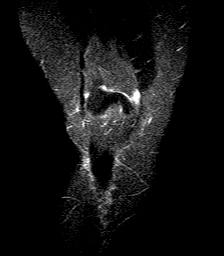
[im 8/20]
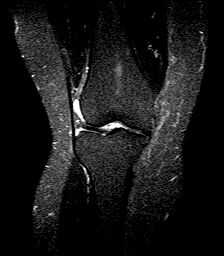
[im 12/20]
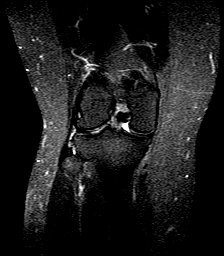
[im 16/20]
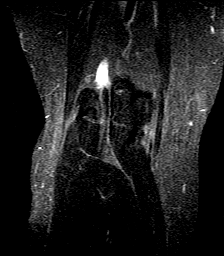
[im 20/20]
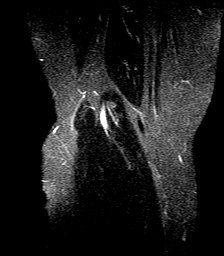

[Series 8: T2 · sagittal · 4.0mm · 0.90mm/px · 6 of 20 slices shown (1 of 2)]
[im 1/20]
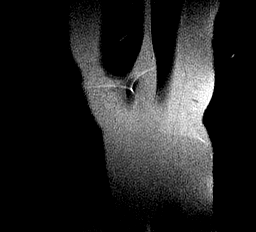
[im 4/20]
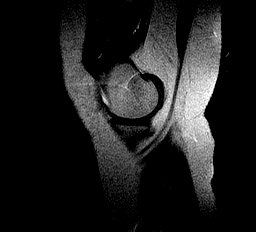
[im 8/20]
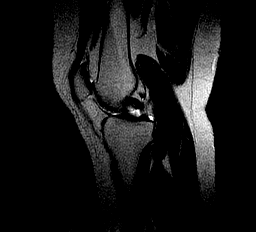
[im 12/20]
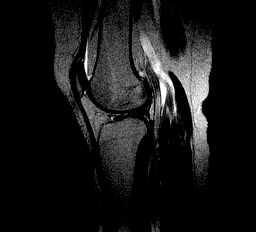
[im 16/20]
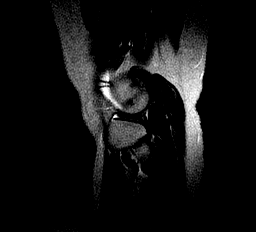
[im 20/20]
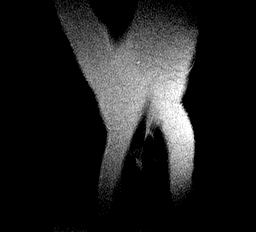

[Series 9: STIR · sagittal · 4.0mm · 0.90mm/px · 6 of 20 slices shown (3 of 3)]
[im 1/20]
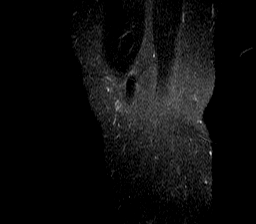
[im 4/20]
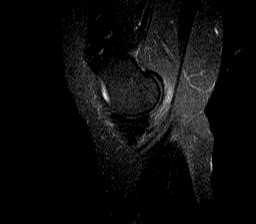
[im 8/20]
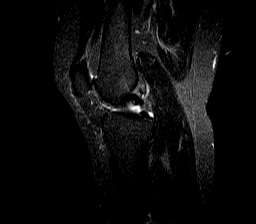
[im 12/20]
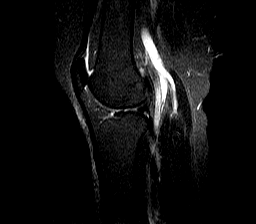
[im 16/20]
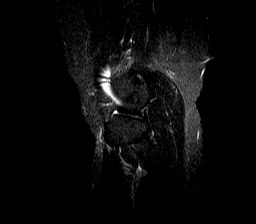
[im 20/20]
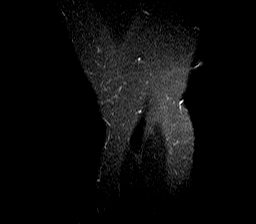

[Series 10: GRE · axial · 3.5mm · 0.45mm/px · z∈[+7,+49]mm · 4 of 12 slices shown]
[im 1/12]
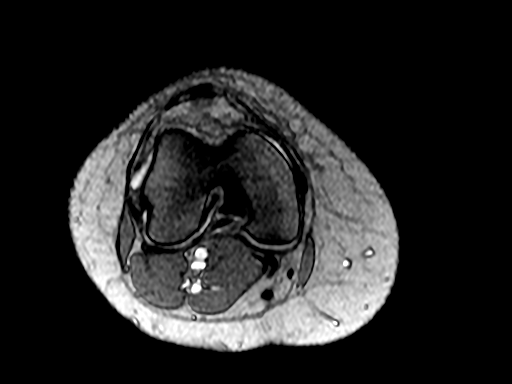
[im 4/12]
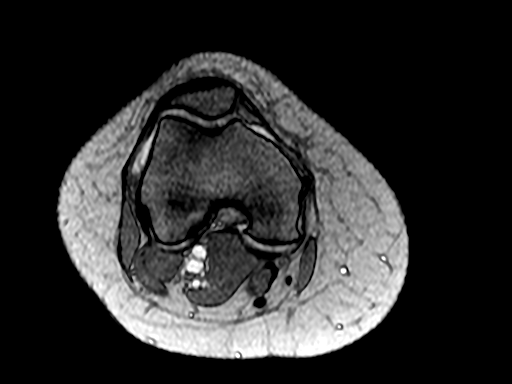
[im 8/12]
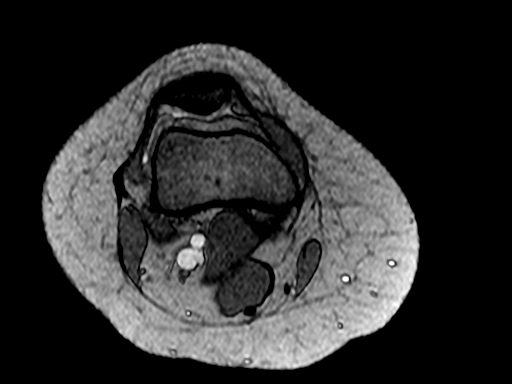
[im 12/12]
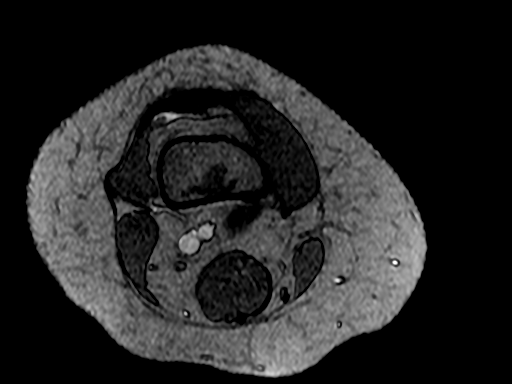

[Series 11: T2 · coronal · 3.0mm · 1.04mm/px · 3 of 10 slices shown (2 of 2)]
[im 1/10]
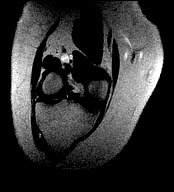
[im 5/10]
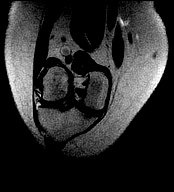
[im 10/10]
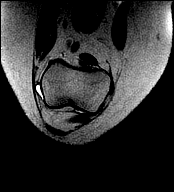

[37 of 40 positions shown; findings below may reference images not displayed]

RESSONÂNCIA MAGNÉTICA DO JOELHO DIREITO
TÉCNICA:
Exame realizado com seqüências multiplanares em diferentes ponderações, em aparelho de baixo campo, sem a injeção
venosa do meio de contraste paramagnético.
OS SEGUINTES ASPECTOS FORAM OBSERVADOS:
Leve edema da tela subcutânea na face anterior do joelho.
Estrutura óssea anatômica.
Redução da fenda articular femoropatelar, associado a sinal discretamente heterogêneo da cartilagem de revestimento da
patela, inferindo condropatia, sem redução significativa da espessura.
Meniscos com morfologias e características de sinal habituais.
Ligamentos cruzados e colaterais com espessuras, trajetos e características de sinal preservados.
Tendão quadricipital e ligamento patelar sem alterações. 
Gordura de Hoffa algo infiltrada.
Mínimo derrame articular.
Fossa poplítea sem alterações.
Grupamentos musculares visibilizados com morfologias e características de sinal habituais.
Paciente:WOP KHUU

## 2021-11-19 IMAGING — MR JOELHO^ESQUERDO
7 series · 40 of 40 positions shown · non-contrast
Comparison: none

[Series 3: STIR · axial · 4.4mm · 1.04mm/px · z∈[-80,+24]mm · 6 of 20 slices shown (1 of 3)]
[im 1/20]
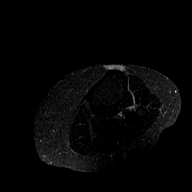
[im 4/20]
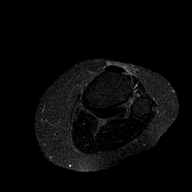
[im 8/20]
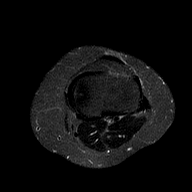
[im 12/20]
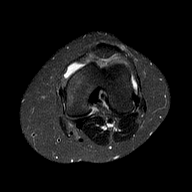
[im 16/20]
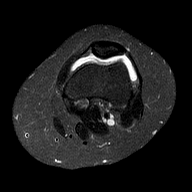
[im 20/20]
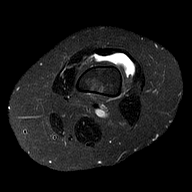

[Series 4: T1 · coronal · 4.0mm · 0.90mm/px · 6 of 20 slices shown]
[im 1/20]
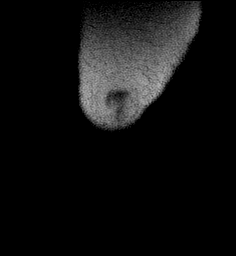
[im 4/20]
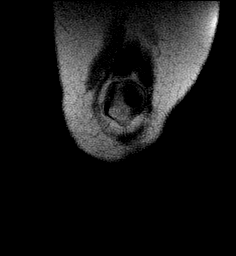
[im 8/20]
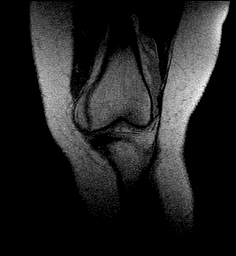
[im 12/20]
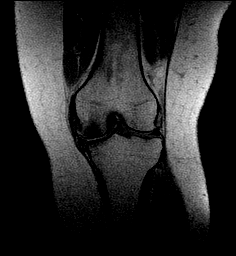
[im 16/20]
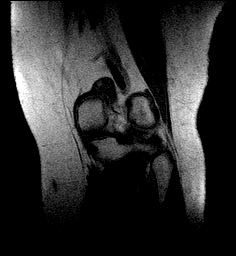
[im 20/20]
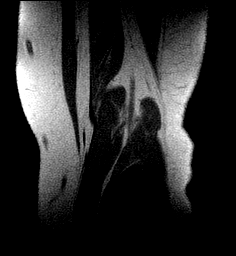

[Series 5: STIR · coronal · 4.0mm · 0.90mm/px · 7 of 20 slices shown (2 of 3)]
[im 1/20]
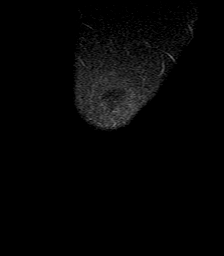
[im 4/20]
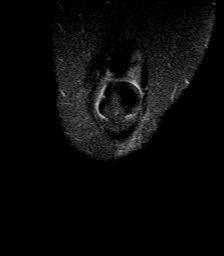
[im 7/20]
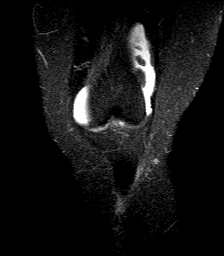
[im 10/20]
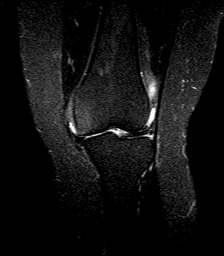
[im 13/20]
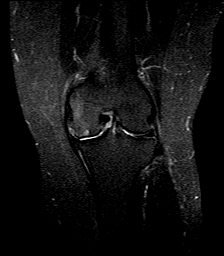
[im 16/20]
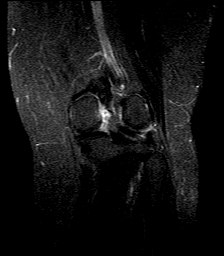
[im 20/20]
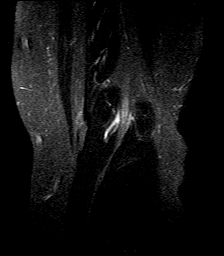

[Series 6: T2 · sagittal · 4.0mm · 0.90mm/px · 7 of 20 slices shown (1 of 2)]
[im 1/20]
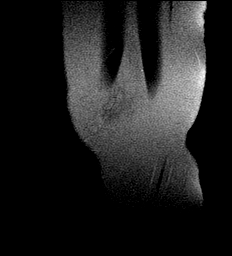
[im 4/20]
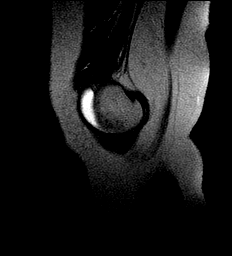
[im 7/20]
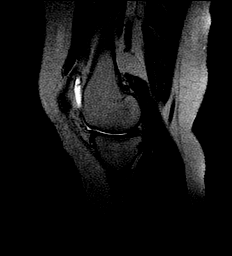
[im 10/20]
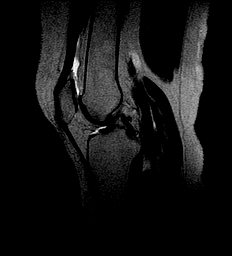
[im 13/20]
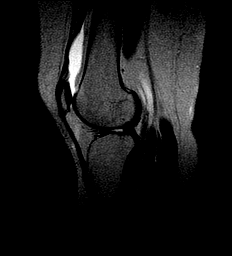
[im 16/20]
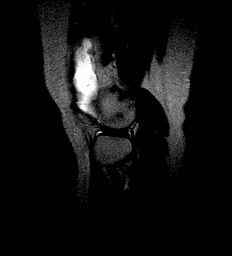
[im 20/20]
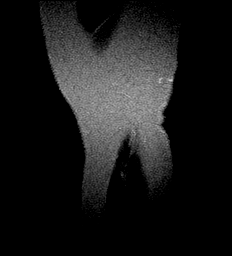

[Series 7: STIR · sagittal · 4.0mm · 0.90mm/px · 7 of 20 slices shown (3 of 3)]
[im 1/20]
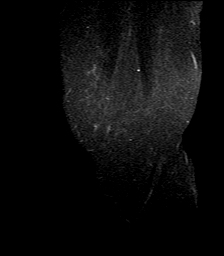
[im 4/20]
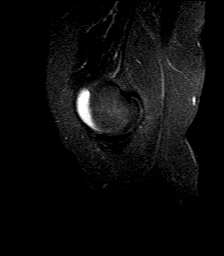
[im 7/20]
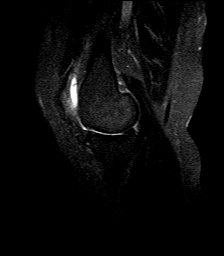
[im 10/20]
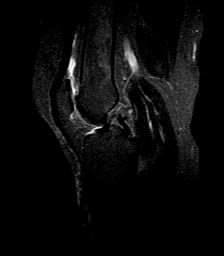
[im 13/20]
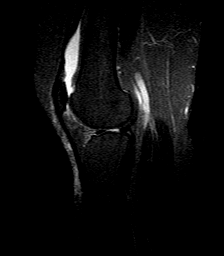
[im 16/20]
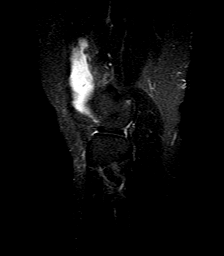
[im 20/20]
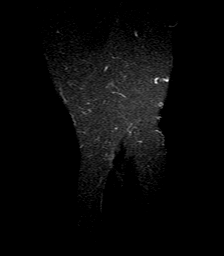

[Series 8: GRE · axial · 3.5mm · 0.45mm/px · z∈[-25,+25]mm · 4 of 12 slices shown]
[im 1/12]
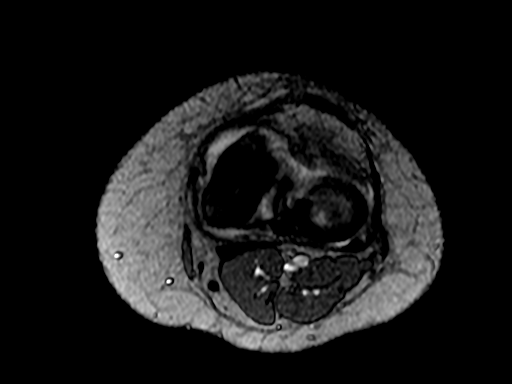
[im 4/12]
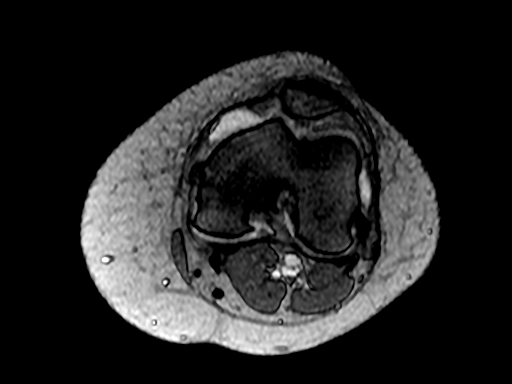
[im 8/12]
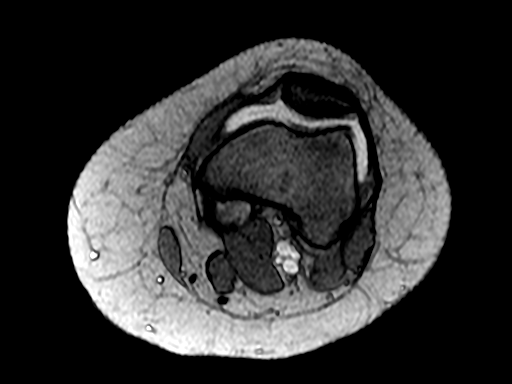
[im 12/12]
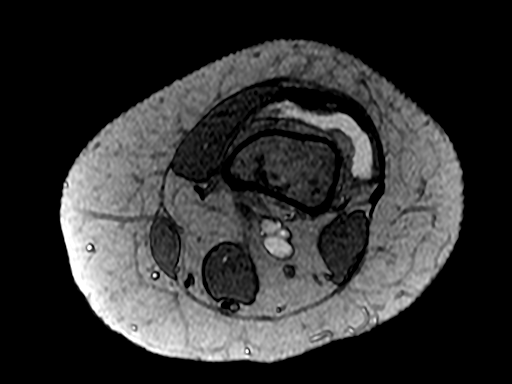

[Series 9: T2 · oblique · 3.0mm · 1.04mm/px · 3 of 10 slices shown (2 of 2)]
[im 1/10]
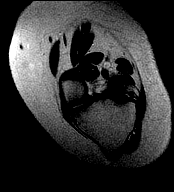
[im 5/10]
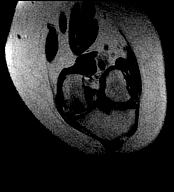
[im 10/10]
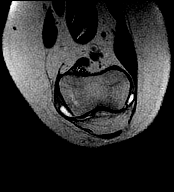

[40 of 40 positions shown; findings below may reference images not displayed]

RESSONÂNCIA MAGNÉTICA DO JOELHO DIREITO
TÉCNICA:
Exame realizado com seqüências multiplanares em diferentes ponderações, em aparelho de baixo campo, sem a injeção
venosa do meio de contraste paramagnético.
OS SEGUINTES ASPECTOS FORAM OBSERVADOS:
Leve edema da tela subcutânea na face anterior do joelho.
Estrutura óssea anatômica.
Redução da fenda articular femoropatelar, associado a sinal discretamente heterogêneo da cartilagem de revestimento da
patela, inferindo condropatia, sem redução significativa da espessura.
Meniscos com morfologias e características de sinal habituais.
Ligamentos cruzados e colaterais com espessuras, trajetos e características de sinal preservados.
Tendão quadricipital e ligamento patelar sem alterações. 
Gordura de Hoffa algo infiltrada.
Mínimo derrame articular.
Fossa poplítea sem alterações.
Grupamentos musculares visibilizados com morfologias e características de sinal habituais.
Paciente:WOP KHUU

## 2023-06-21 ENCOUNTER — Other Ambulatory Visit: Payer: Self-pay | Admitting: Internal Medicine
